# Patient Record
Sex: Female | Born: 1960 | Race: White | Hispanic: No | Marital: Married | Smoking: Never smoker
Health system: Southern US, Community
[De-identification: ages and names within clinical notes are randomized; demographics above are authoritative.]

## PROBLEM LIST (undated history)

## (undated) DIAGNOSIS — Z9221 Personal history of antineoplastic chemotherapy: Secondary | ICD-10-CM

## (undated) DIAGNOSIS — C801 Malignant (primary) neoplasm, unspecified: Secondary | ICD-10-CM

## (undated) DIAGNOSIS — Z923 Personal history of irradiation: Secondary | ICD-10-CM

## (undated) DIAGNOSIS — T7840XA Allergy, unspecified, initial encounter: Secondary | ICD-10-CM

## (undated) DIAGNOSIS — I2510 Atherosclerotic heart disease of native coronary artery without angina pectoris: Secondary | ICD-10-CM

## (undated) DIAGNOSIS — E7800 Pure hypercholesterolemia, unspecified: Secondary | ICD-10-CM

## (undated) DIAGNOSIS — Z9889 Other specified postprocedural states: Secondary | ICD-10-CM

## (undated) DIAGNOSIS — I519 Heart disease, unspecified: Secondary | ICD-10-CM

## (undated) DIAGNOSIS — R112 Nausea with vomiting, unspecified: Secondary | ICD-10-CM

## (undated) DIAGNOSIS — B009 Herpesviral infection, unspecified: Secondary | ICD-10-CM

## (undated) HISTORY — DX: Herpesviral infection, unspecified: B00.9

## (undated) HISTORY — PX: CORONARY ANGIOPLASTY: SHX604

## (undated) HISTORY — DX: Personal history of irradiation: Z92.3

## (undated) HISTORY — DX: Personal history of antineoplastic chemotherapy: Z92.21

## (undated) HISTORY — PX: POLYPECTOMY: SHX149

## (undated) HISTORY — PX: COLONOSCOPY: SHX174

## (undated) HISTORY — DX: Heart disease, unspecified: I51.9

---

## 1998-02-07 ENCOUNTER — Other Ambulatory Visit: Admission: RE | Admit: 1998-02-07 | Discharge: 1998-02-07 | Payer: Self-pay | Admitting: Gynecology

## 2000-03-01 ENCOUNTER — Other Ambulatory Visit: Admission: RE | Admit: 2000-03-01 | Discharge: 2000-03-01 | Payer: Self-pay | Admitting: Gynecology

## 2001-11-28 ENCOUNTER — Other Ambulatory Visit: Admission: RE | Admit: 2001-11-28 | Discharge: 2001-11-28 | Payer: Self-pay | Admitting: Gynecology

## 2001-12-08 ENCOUNTER — Encounter: Payer: Self-pay | Admitting: Gynecology

## 2001-12-08 ENCOUNTER — Encounter: Admission: RE | Admit: 2001-12-08 | Discharge: 2001-12-08 | Payer: Self-pay | Admitting: Gynecology

## 2003-01-31 ENCOUNTER — Encounter: Admission: RE | Admit: 2003-01-31 | Discharge: 2003-01-31 | Payer: Self-pay | Admitting: Gynecology

## 2003-10-09 ENCOUNTER — Other Ambulatory Visit: Admission: RE | Admit: 2003-10-09 | Discharge: 2003-10-09 | Payer: Self-pay | Admitting: Gynecology

## 2004-01-22 ENCOUNTER — Other Ambulatory Visit: Admission: RE | Admit: 2004-01-22 | Discharge: 2004-01-22 | Payer: Self-pay | Admitting: Gynecology

## 2004-07-23 ENCOUNTER — Encounter: Admission: RE | Admit: 2004-07-23 | Discharge: 2004-07-23 | Payer: Self-pay | Admitting: Gynecology

## 2004-10-10 ENCOUNTER — Other Ambulatory Visit: Admission: RE | Admit: 2004-10-10 | Discharge: 2004-10-10 | Payer: Self-pay | Admitting: Gynecology

## 2005-03-09 ENCOUNTER — Ambulatory Visit: Payer: Self-pay | Admitting: Internal Medicine

## 2005-08-20 ENCOUNTER — Encounter: Admission: RE | Admit: 2005-08-20 | Discharge: 2005-08-20 | Payer: Self-pay | Admitting: Gynecology

## 2005-10-13 ENCOUNTER — Other Ambulatory Visit: Admission: RE | Admit: 2005-10-13 | Discharge: 2005-10-13 | Payer: Self-pay | Admitting: Gynecology

## 2006-04-06 DIAGNOSIS — I519 Heart disease, unspecified: Secondary | ICD-10-CM

## 2006-04-06 HISTORY — DX: Heart disease, unspecified: I51.9

## 2006-06-05 ENCOUNTER — Ambulatory Visit: Payer: Self-pay | Admitting: Internal Medicine

## 2006-06-05 ENCOUNTER — Inpatient Hospital Stay (HOSPITAL_COMMUNITY): Admission: EM | Admit: 2006-06-05 | Discharge: 2006-06-11 | Payer: Self-pay | Admitting: Emergency Medicine

## 2006-06-05 HISTORY — PX: CARDIAC SURGERY: SHX584

## 2006-06-06 ENCOUNTER — Encounter: Payer: Self-pay | Admitting: Internal Medicine

## 2006-06-21 ENCOUNTER — Ambulatory Visit: Payer: Self-pay | Admitting: Internal Medicine

## 2006-06-21 ENCOUNTER — Ambulatory Visit: Payer: Self-pay

## 2006-06-21 ENCOUNTER — Encounter: Payer: Self-pay | Admitting: Cardiology

## 2006-07-22 ENCOUNTER — Encounter (HOSPITAL_COMMUNITY): Admission: RE | Admit: 2006-07-22 | Discharge: 2006-10-20 | Payer: Self-pay | Admitting: Internal Medicine

## 2006-08-09 ENCOUNTER — Ambulatory Visit: Payer: Self-pay | Admitting: Internal Medicine

## 2006-08-23 ENCOUNTER — Encounter: Admission: RE | Admit: 2006-08-23 | Discharge: 2006-08-23 | Payer: Self-pay | Admitting: Gynecology

## 2006-11-17 ENCOUNTER — Encounter (HOSPITAL_COMMUNITY): Admission: RE | Admit: 2006-11-17 | Discharge: 2006-11-19 | Payer: Self-pay | Admitting: Internal Medicine

## 2007-01-07 ENCOUNTER — Ambulatory Visit: Payer: Self-pay | Admitting: Internal Medicine

## 2007-05-09 ENCOUNTER — Other Ambulatory Visit: Admission: RE | Admit: 2007-05-09 | Discharge: 2007-05-09 | Payer: Self-pay | Admitting: Gynecology

## 2007-08-25 ENCOUNTER — Encounter: Admission: RE | Admit: 2007-08-25 | Discharge: 2007-08-25 | Payer: Self-pay | Admitting: Gynecology

## 2007-11-24 ENCOUNTER — Ambulatory Visit: Payer: Self-pay | Admitting: Internal Medicine

## 2007-11-30 ENCOUNTER — Ambulatory Visit: Payer: Self-pay | Admitting: Internal Medicine

## 2007-11-30 LAB — CONVERTED CEMR LAB: TSH: 1.66 microintl units/mL (ref 0.35–5.50)

## 2007-12-28 ENCOUNTER — Ambulatory Visit: Payer: Self-pay | Admitting: Gynecology

## 2008-09-11 ENCOUNTER — Encounter: Admission: RE | Admit: 2008-09-11 | Discharge: 2008-09-11 | Payer: Self-pay | Admitting: Gynecology

## 2009-02-19 ENCOUNTER — Telehealth: Payer: Self-pay | Admitting: Internal Medicine

## 2009-02-22 ENCOUNTER — Telehealth: Payer: Self-pay | Admitting: Internal Medicine

## 2010-05-06 NOTE — Progress Notes (Signed)
Summary: pt needs letter faxed to her   Phone Note Call from Patient Call back at Home Phone [phone]   Caller: Patient Reason for Call: Talk to Nurse Summary of Call: Letter dated 11/17 that Dr. Tenny Craw wrote needs to be faxed to [phone] Attn: to the patient Initial call taken by: Omer Jack,  February 22, 2009 12:25 PM  Follow-up for Phone Call        letter faxed, pt home # is disconnected, lm on pts work vm that it had been faxed to call back if didn't receive or had quefstions Meredith Staggers, RN  February 22, 2009 2:13 PM

## 2010-05-06 NOTE — Progress Notes (Signed)
Summary: refund from trip   Phone Note Call from Patient Call back at Home Phone [phone] Call back at [phone]   Caller: Patient Reason for Call: Talk to Nurse Details for Reason: Pt has a trip plan for [place] 12/17- Jan 1 or 18. hubsand fx leg. unable to go on hiking trip.  would like for dr. Tenny Craw to write a note concerning pt dx. so pt can get an refund.  Initial call taken by: Lorne Skeens,  February 19, 2009 10:56 AM  Follow-up for Phone Call        Spoke with Lizet which would like for Dr. Tenny Craw to write a not about pt's diagnosis. Pt. had planned a trip to [place] in 12/17th. Pt's husband fractured his leg. Pt. would like to get money back. RN let pt. know will send this message to MD and nurse. Okey with pt. Ollen Gross, RN, BSN  February 19, 2009 11:13 AM   Additional Follow-up for Phone Call Additional follow up Details #1::        Dictated letter Additional Follow-up by: Sherrill Raring, MD, [facility],  February 21, 2009 10:14 AM

## 2010-08-19 NOTE — Assessment & Plan Note (Signed)
[facility]                            CARDIOLOGY OFFICE NOTE   NAME:Sardina, JASMEET MANTON                         MRN:          [id number]  DATE:11/24/2007                            DOB:          1961/02/27    IDENTIFICATION:  Ms. Sirmon is a 50 year old woman.  I last saw her back  in October of last year.  She has a history of probable connective-  tissue disorder and had history of dissection of her LAD requiring drug-  eluting stent x3 (March 2008).  Note, she has been seen by Dr. Izell Los Veteranos II at  [facility].   Since seen, she has been doing very well.  She denies chest pain.  No  symptoms like the day she had her event.  She is active.  She is trying  to increase her activity, but again is careful to avoid any extreme  decelerations as she does in combative tennis.  She is going to enlist  in private training with light weights.   MEDICATIONS:  1. Plavix 75.  2. Toprol 50 b.i.d.  3. Lipitor 80.  4. Aspirin 81 mg daily.   PHYSICAL EXAMINATION:  The patient is in no distress.  Blood pressure is  127/80, pulse is 56 and regular.  NECK:  JVP is normal.  CARDIAC EXAM:  Regular rate and rhythm, S1, S2.  No S3.  No significant  murmurs.  ABDOMEN:  Benign.  EXTREMITIES:  No edema.  A 12-lead EKG sinus bradycardia 59 beats per  minute, nonspecific T-wave changes.   IMPRESSION:  1. Coronary artery disease.  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  Doing well.  Would keep her on aspirin and      Plavix.  She is now a year and half out from the event.  2. Dyslipidemia.  Due to have fasting lipids, LipoMed, next week, as      well as AST, ALT.   I will set to see the patient in March.  Encouraged her to stay active  again, and she is careful to monitor her levels and heart rates.     Pricilla Riffle, MD, [facility]  Electronically Signed    PVR/MedQ  DD: 11/24/2007  DT: 11/25/2007  Job #: [id number]

## 2010-08-19 NOTE — Assessment & Plan Note (Signed)
[facility]                            CARDIOLOGY OFFICE NOTE   NAME:Melanie Cook, Melanie Cook                         MRN:          [id number]  DATE:01/07/2007                            DOB:          04/15/1960    Melanie Cook is a 50 year old woman with history of dissection of her LAD  with a drug eluting stent placement x3 in March of this year. I last saw  her back in May.   In the interval she has been seen [facility].  Has not heard back fully from  her results but was told that she did not have a remarkable mutation.   The patient enrolled in some cardiac rehab but it became very difficult  with her work schedule.  She is now walking and eager to get back to  some organized physical activity with sport time.   She denies chest pain, no dizziness, feels she is able to do things.  When she is active she can get her heart rate into the 130 range.   CURRENT MEDICATIONS:  Include aspirin 325, Plavix 75, Toprol XL 100,  Lipitor stopped, Losartan stopped because of cough.   PHYSICAL EXAMINATION:  The patient is in no distress. Her blood pressure  is 102/76, pulse is 55, weight 141.  NECK:  JVP is normal.  LUNGS:  Clear.  CARDIAC EXAM:  Regular rate and rhythm S1, S2, no S3, no murmurs, no  clicks.  ABDOMEN:  Benign.  EXTREMITIES:  No edema.   IMPRESSION:  1. Coronary artery dissection with stents to the LAD, diagonal.      Clinically doing well.  I would keep her, with her history, on the      regimen she is on. She can back down on aspirin to 81 mg.  Her      blood pressure is marginal, I would like to use the beta blocker      for heart rate control and will hold off on losartan, she said she      coughed violently even on this.  Had problems with lisinopril.  2. Dyslipidemia.  Marked dyslipidemia with an LDL of 178 back in May,      she needs the Lipitor; I encouraged her and she will start back on      this with follow up labs in 8 weeks.  3. Health  care maintenance - I will be in touch with Dr. Izell [facility].  I      also said she could go ahead and work out at sport time again,      watching heart rate.  No heavy lifting, more for tone.   Followup in 6 months.    Pricilla Riffle, MD, [facility]  Electronically Signed   PVR/MedQ  DD: 01/08/2007  DT: 01/08/2007  Job #: [id number]

## 2010-08-19 NOTE — Assessment & Plan Note (Signed)
[facility]                            CARDIOLOGY OFFICE NOTE   NAME:Eichhorn, SAMANI DEAL                         MRN:          [id number]  DATE:08/09/2006                            DOB:          [birth year]    IDENTIFICATION:  Ms. Toole is a 50 year old woman who I follow in  clinic.  She had a dissection of her LAD and underwent stent placement  (non drug-eluting x3 to the LAD and 2nd diagonal back in early March).  I last saw her in clinic on March 17th.   In the interval she has done well.  She has been seen at [facility]  by Dr. Letha Cape for genetic evaluation.  She has had some blood work  drawn, but has not received the results back.   She continues on in cardiac rehab.  She started this a couple weeks ago  and is doing well, actually found that things were moving along slowly.  I have spoken to the physical therapist.  Blood pressures have been in  the 90s to 100s, 112 at the highest, lowest blood pressure was 86/66.  The patient has been asymptomatic through this.  Heart rate has been  allowed to climb into the 120s.  The patient has not had a problem with  this, and I okayed advancing this last week.   The patient denies dizziness, energy level has gone up since back in  March.  She denies chest pain, no PND, no back pain.   CURRENT MEDICATIONS:  1. Aspirin 325 daily.  2. Plavix 75 daily.  3. Toprol XL 100 daily.  4. Lisinopril 10 daily.  5. Lipitor 80 daily.   PHYSICAL EXAMINATION:  The patient is in no distress.  Blood pressure 97/68, pulse is 64, weight 139, stable.  LUNGS:  Clear.  NECK:  JVP is normal.  CARDIAC EXAM:  Regular rate and rhythm, S1, S2, no S3, S4.  No murmurs.  ABDOMEN:  Benign.  EXTREMITIES:  No edema, good pulses.   A 12-lead EKG shows sinus rhythm at 62 beats per minute.  There is  trivial ST elevation in II and F.  Trivial ST elevation V1-V4.  Concave  in all leads.  T-wave inversion V1, V2.  T-wave  inversion noted in March  in V1-V4.   IMPRESSION:  Ms. Strike is a 50 year old woman with dissection to her  left anterior descending while in a doubles tennis match back in March.  She received 3 stents to the left anterior descending.  Will remain on  the current medical regimen.  She is tolerating it well symptom wise.  Her blood pressure has been on the lower side.  If she becomes  symptomatic, I would pull back on the Lisinopril a bit before stopping.   Note:  She does note a dry cough, I did not mention this earlier, since  discharge.  She thinks it is getting worse.  I told her for a few days  to hold the Lisinopril, and we would see if her symptoms resolve.  If  they do, I would choose  to put her on a low-dose angiotensin receptor  blocker instead.  I will call her to check up on this.  She will  continue on in cardiac rehab, again, with advancement in her target  metabolic rate.  We have discussed limitations based on the guidelines.  I think, again, no impact sports, avoiding sudden sharp changes in  activity (rapid stops/starts), leaning towards more non-competitive  activities.  This could include tennis, jogging, biking, and, again, can  be modified depending on how she responds.   I will set to see her back in a few months.  Again, I will be in touch  with her regarding her response to the Lisinopril discontinuation.  She  should now come in for a fasting lipid panel as well to follow her  response on Lipitor.     Pricilla Riffle, MD, [facility]  Electronically Signed    PVR/MedQ  DD: 08/10/2006  DT: 08/10/2006  Job #: [id number]   cc:   Lahoma Crocker, MD

## 2010-08-22 NOTE — Assessment & Plan Note (Signed)
[facility]                            CARDIOLOGY OFFICE NOTE   NAME:Melanie Cook, Melanie Cook                         MRN:          [id number]  DATE:06/21/2006                            DOB:          September 19, 1960    IDENTIFICATION:  The patient is a 50 year old woman who I followed in  the hospital.  She had a dissection of her LAD, underwent stent  placement on March 5 (bare metal stent x3 to the LAD and into the second  diagonal).  She was discharged home 2 days after.  Since seen she has  been doing well, she denies chest pain.  She is doing activities as  tolerated, has not begun driving yet.  She says yesterday she did some  paperwork, went to a meeting, and took a walk with her daughter.  By the  end of the day she was very tired.  She denied any significant shortness  of breath or fatigue during these activities, but by 8 pm was ready for  bed.  Went to bed, slept until 7 am.   Denies dizziness.   CURRENT MEDICATIONS:  1. Aspirin 325 daily.  2. Plavix 75 daily.  3. Toprol XL 100 daily.  4. Lisinopril 10 daily.  5. Lipitor 80 daily.   PHYSICAL EXAMINATION:  On exam the patient is in no distress.  Blood  pressure 104/68, pulse is 60 and regular, weight 138.  NECK:  JVP is normal.  LUNGS:  Clear.  CARDIAC:  Regular rate and rhythm, S1, S2, no S3, no murmurs.  ABDOMEN:  Benign with mild left sided tenderness.  EXTREMITIES:  Right groin without hematoma or bruit, 2+ distal pulses,  no edema.  A 12-LEAD EKG:  Shows normal sinus bradycardia 56 beats per minute, PR  interval 186 milliseconds.  Septal MI.  T wave inversion V2, V1-V4  (different from hospitalization).   A 2D echo today done shows an LVF that is normal, about 65%.  There is  some anterior septal hypokinesis and distal posterior hypokinesis.  Seems to be improved though from echo done in the hospital.   IMPRESSION:  Melanie Cook is a 50 year old woman who had spontaneous  dissection of her  left anterior descending.  Her aortic root and aorta  are normal.  Initial plan was for medical therapy but she developed  worsening symptoms and actually worsened flow through the left anterior  descending and on the 5th underwent stent placement.  She was left with  residual dissection of the distal left anterior descending and there was  a small dissection over in the second diagonal.   Clinically she is doing well recovering.   PLAN:  To continue medical therapy.  I have talked to her about all her  medicines today extensively (one hour total).  She should remain on the  Plavix for now, question longterm plans for this.  Continue on aspirin.  Keep the beta blocker at a current dose, I am not convinced her fatigue  is due to this, I think it may be getting back into her activities.  Consider  backing down though to 75 if her symptoms do not improve.  Continue on lisinopril for now.  I would also continue on a statin, we  will follow up lipids in April.  Again, I have reviewed her  echocardiograms with her, her catheterization with her.  I have  discussed overall plans somewhat.  Again we hope there will not be any  further dissection in the LAD, but again will need follow clinically.   I would increase activities slowly.  I think she can drive, I think she  is able to return to some work, probably a third of her normal.  I would  discourage traveling right now on her own, but I think she is okay to  travel with her family.   I have discussed her case with Dr. Lahoma Crocker at [facility], she has  agreed to see Melanie Cook as an initial evaluation with hopes that the  family can be evaluated fully.  I will send her records to [facility] and they  will contact the patient for her appointment.   For now, I would like to see the patient back in about 6 weeks, sooner  if problems develop.  If she has any problems of course she should call  sooner.     Pricilla Riffle, MD, [facility]  Electronically  Signed    PVR/MedQ  DD: 06/21/2006  DT: 06/22/2006  Job #: [id number]   cc:   Melanie Mends. Fabian Sharp, MD

## 2010-08-22 NOTE — Letter (Signed)
February 20, 2009     RE:  [name], [name]  MRN:  [id number]  /  DOB:  [birth year]   To Whom It May Concern:   Melanie Cook is the patient I follow in Cardiology Clinic.  She has a  history of coronary artery dissection a couple years ago and has had  several stents placed in one of her coronary arteries.  She has a  probable connective tissue disorder.   With this, I have limited her activities.  I do not want her doing any  heavy lifting where she has to strain, I do not want her doing any rapid  accelerations and activities such as sprinting.  I have her on  medication to control her heart rate.   It has come to my attention that her husband recently fractured his leg  and is going to have surgery.  They had planned a trip to [place]  beginning March 22, 2009.  The trip was planned to be a hiking  vacation; however without her husband there I do not want her to have to  handle the packs on her own, along with her children.  I am requesting  that you provide refund for this trip.  Unfortunately, without her  husband being able to go, I do not think it is safe for her to try to do  things on her own with her children.  If you have any questions, please  feel free to contact me at [phone].    Sincerely,      Pricilla Riffle, MD, [facility]  Electronically Signed    PVR/MedQ  DD: 02/20/2009  DT: 02/21/2009  Job #: [id number]

## 2010-08-22 NOTE — Cardiovascular Report (Signed)
Melanie Cook, Melanie Cook NO.:  [id number]   MEDICAL RECORD NO.:  [id number]          PATIENT TYPE:  INP   LOCATION:  2921                         FACILITY:  [facility]   PHYSICIAN:  Salvadore Farber, MD  DATE OF BIRTH:  21-Jul-1960   DATE OF PROCEDURE:  06/09/2006  DATE OF DISCHARGE:                            CARDIAC CATHETERIZATION   PROCEDURE:  Coronary angiography, intravascular ultrasound of the LAD,  bare metal stent x3 to the LAD with extension into the proximal second  diagonal branch.   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  She underwent diagnostic angiography by Dr.  Riley Kill on the 3rd which demonstrated this.  At that time, there was  substantial flow in the true lumen with a small false lumen and she had  been pain-free for 48 hours.  Therefore,  conservative management was  recommended.   This morning, she developed recurrent chest discomfort associated with  dynamic ST changes in the anterior leads.  After discussion with Dr.  Riley Kill, we therefore decided to bring her back to the cardiac  catheterization lab for repeat angiography and possible coronary  intervention.   PROCEDURAL TECHNIQUE:  Informed consent was obtained.  Under 1%  lidocaine local anesthesia, a 5-French sheath was placed in the right  common femoral artery using the modified Seldinger technique.  Diagnostic angiography was performed using JL-4 and JR-4 catheters.  This demonstrated 99% stenosis in the mid-LAD over a long segment.  There was TIMI I flow and more extensive flow in the false lumen then in  the true lumen.  I discussed the findings with Dr. Riley Kill.  We both  agreed that given the TIMI I flow and ongoing discomfort we should  attempt percutaneous revascularization.   Anticoagulation was initiated with bivalirudin.  The patient had already  been maintained on Plavix and aspirin.  Sheath was upsized over wire to  6-French.  A 6-French CLS 3.5 guide was advanced over wire and engaged  in the ostium of the left main.  I began with a Hi-Torque floppy wire.  I carefully manipulated this into what appeared to be the true lumen.  I  advanced it through the majority of the dissected segment.  I then  advanced a 1.5 x 12-mm Voyager over the wire balloon over this into the  distal portion of the dissected segment.  I withdrew the wire and  performed injection of dilute contrast.  This confirmed a position  within the true lumen.  I then manipulated the wire into the second  diagonal branch.  I reconfirmed position within the true lumen of this  again by contrast injection.  I then exchanged for a Prowater wire  positioning in the diagonal.  I then proceeded to balloon angioplasty  using the 1.5 mm balloon over the entirety of the dissected segment.  Repeat angiography demonstrated TIMI III flow, further confirming that  we were within the true lumen.  I then performed intravascular  ultrasound.  I was unable to identify a clear entry flap.  After  discussion with Dr. Riley Kill, we decided  she would be best served with  stenting of the entire effected segment.   I began by stenting proximally by placing a 3.0 x 16-mm Liberte stent  just proximal to the first diagonal.  I deployed it at 16 atmospheres.  I then advanced a 2.75 x 32-mm Liberte stent to overlap the distal  portion of this stent and just before the takeoff of the second diagonal  branch.  I deployed this at 14 atmospheres.  I then postdilated the  stent using a 3.0-mm Quantum distally at 16 atmospheres and 18  atmospheres at the overlap segment.  I then further postdilated using a  3.5-mm Quantum at the most proximal edge.  This was at 16 atmospheres.  It was then advanced to the segment of overlap and inflated to 18  atmospheres.  I then performed intravascular ultrasound.   This  demonstrated incomplete apposition of the most proximal 3 mm of the  stented segment.  I then further postdilated using a 4-mm Quantum at 16  atmospheres.  Repeat intravascular ultrasound demonstrated excellent  apposition.   There remained dissection flap distal to the stented segment with  extension into both the second diagonal and the apical LAD.  As the  second diagonal was bigger than the apical LAD, we elected to place an  additional stent to the distal margin of the previously stented segment  out into D2.  To that end, I advanced a 2.0 x 12-mm Mini-Vision and  deployed it at 14 atmospheres.  I postdilated the stent using a 2.25 x 8-  mm Quantum at 14 atmospheres distally.  I then postdilated the proximal  segment using the 3.0 x 15-mm Quantum at 16 atmospheres.  Final  angiography demonstrated no residual stenosis in the stented segment, no  residual dissection within the stented segment, TIMI III flow into both  the second diagonal and apical LAD.  There did remain non-flow limiting  dissections within the second diagonal and distal LAD.  As these vessels  were small diameter, I elected to manage them conservatively with  administration of eptifibatide.  Flow had improved from TIMI I to TIMI  III through the procedure.   COMPLICATIONS:  None.   FINDINGS:  1. Left main:  Angiographically normal.  2. LAD:  A 99% deep dissection with TIMI I flow and a long extension      of the dissection.  This was stented to no residual stenosis.  3. Circumflex:  Moderate-sized vessel giving rise to 2 marginals.  It      is angiographically normal.  4. RCA:  Moderate-sized dominant vessel.  It is angiographically      normal.   IMPRESSION/PLAN:  Successful percutaneous revascularization of the left  anterior descending artery and second diagonal branch using bare metal  stent.  We will manage the residual, non-flow limiting dissection with  eptifibatide for 18  hours.     Salvadore Farber, MD  Electronically Signed     WED/MEDQ  D:  06/09/2006  T:  06/10/2006  Job:  [id number]

## 2010-08-22 NOTE — Cardiovascular Report (Signed)
NAMEBEN, SANZ NO.:  [id number]   MEDICAL RECORD NO.:  [id number]          PATIENT TYPE:  INP   LOCATION:  2022                         FACILITY:  [facility]   PHYSICIAN:  Arturo Morton. Riley Kill, MD, FACCDATE OF BIRTH:  [birth year]   DATE OF PROCEDURE:  06/07/2006  DATE OF DISCHARGE:                            CARDIAC CATHETERIZATION   INDICATIONS:  Ms. Shugars is a very nice 50 year old female who presented  after playing tennis with discomfort.  Both troponins and CK MBs were  positive.  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  A  concrete diagnosis of Marfan's, however, is not noted.  The current  study was done to assess her coronary anatomy.   PROCEDURES:  1. Left heart catheterization  2. Selective coronary arteriography.  3. Selective left ventriculography.  4. Aortic root aortography.   DESCRIPTION OF PROCEDURE:  The patient was brought to the  catheterization laboratory and prepped and draped in the usual fashion.  Through an anterior puncture, the femoral artery was entered and a 5-  [place] sheath was placed.  Views of the left and right coronary arteries  were then obtained in multiple angiographic projections.  Central aortic  and left ventricular pressures were measured with a pigtail catheter and  ventriculography was performed in the RAO projection.  Following a  pressure pullback, aortography was performed without complication.  She  tolerated the procedure well without complication.  All catheters were  removed and the femoral sheath flushed.   Dr. Tenny Craw, Dr. Excell Seltzer and Dr. Samule Ohm all came into the laboratory where  we carefully discussed the potential options.  There was consensus  opinion that observation with intravenous anticoagulation would be the  best course of action at this time, and she was taken into the holding  area.  Dr. Tenny Craw and I then reviewed the  films with the patient's  husband.  There were no complications.   HEMODYNAMIC DATA:  1. Central aortic pressure 127/79, mean 99.  2. Left ventricular pressure 126/16.  3. No gradient on pullback across aortic valve.   ANGIOGRAPHIC DATA:  1. Ventriculography was done in the RAO projection.  Overall, systolic      function is only mildly depressed and there is a mild wall motion      abnormality involving the distal anterolateral wall and apical      segment.  Ejection fraction would be estimated at 50%.  There was      no significant mitral regurgitation.  2. Aortic root aortography with minimal contrast demonstrates no      obvious evidence of dissection.  There is also no evidence of      aortic regurgitation.  3. The left main is free of critical disease.  4. The left anterior descending artery courses to the apical tip.  The      LAD provides a moderate-sized diagonal going over the anterolateral      wall which is intact.  Just after the takeoff of this diagonal is a  bend point in the artery, and there is some haziness followed by 2      septal perforators.  Just beyond this there is about 70-80%      narrowing with an area of tortuosity; and just beyond this there is      a double density suggesting an external dissection in the vessel      wall.  Distally, the vessel divides into a diagonal branch whose      proximal aspect is slightly more narrow than its distal aspect and      a fairly small caliber distal apical LAD.  5. The circumflex provides a small intermediate, and then a large      marginal and 2 small distal marginals.  The circumflex appears free      of critical disease.  6. The right coronary artery demonstrates smooth contour throughout      with a large posterior descending branch after a small acute      marginal and 2 smaller posterolateral branches.  This appears free      of disease as well.   CONCLUSIONS:  1. Spontaneous mid-left anterior  dissection with possible retrograde      and antegrade hematoma creating haziness in the vessel.  2. Preserved overall LV function with a mild wall motion abnormality      involving the distal LAD territory.  3. No evidence of aortic dissection.   DISPOSITION:  Dr. Samule Ohm, Dr. Excell Seltzer, Dr. Tenny Craw and I have all reviewed  the films in detail.  Our concern is that this likely represents a  spontaneous dissection with possible intramural hematoma extending both  proximally and distally.  The apical portion of the vessel is very small  in caliber, and this could also represent external hematoma as well.  The consensus opinion at this time would be to continue to treat the  patient with anticoagulants, with potential return to the laboratory in  3-4 days.  We will notify the cardiovascular surgeons and have them see  her and be aware of her status as well.  The patient's husband will make  contact and try to determine who has previously seen her brother at the  [facility] with regard to [facility] evaluation.  With regard to  coronary stenting, the consensus opinion was that it would be unclear  where to both start and stop treatment and that much of the narrowing  could likely represent external hematoma.  The patient will be watched  closely.      Arturo Morton. Riley Kill, MD, [facility]  Electronically Signed     TDS/MEDQ  D:  06/07/2006  T:  06/07/2006  Job:  [id number]   cc:   Pricilla Riffle, MD, [facility]  Neta Mends. Panosh, MD  CV Laboratory

## 2010-08-22 NOTE — H&P (Signed)
NAMEAMARACHUKWU, LAKATOS NO.:  [id number]   MEDICAL RECORD NO.:  [id number]          PATIENT TYPE:  INP   LOCATION:  2022                         FACILITY:  [facility]   PHYSICIAN:  Pricilla Riffle, MD, FACCDATE OF BIRTH:  04-Feb-1961   DATE OF ADMISSION:  06/05/2006  DATE OF DISCHARGE:                              HISTORY & PHYSICAL   IDENTIFICATION:  Ms. Given is a 50 year old who came into the emergency  room for complaints of chest pain, back pain.   HISTORY OF PRESENT ILLNESS:  The patient has no known history of heart  problems.  She has never had any problems with chest pain in the past.   She was playing tennis today.  She had a bagel and cream cheese prior.  She actually was doing well, and then she began to feel and indigestion  feeling that went to her back.  Felt like she was breathing cold air.  She sat down and just did not feel good.  Then her joints felt achy, her  fingers became tingly.  She took a drink and then began to feel a little  bit better.  Noted slight nausea through this.  Decided to come to the  emergency room for further evaluation, now feeling okay.  Note, the  patient is getting over a sinus infection, cough, started a few weeks  ago though, now at tail end.  No change in ability to do things, quite  active.   ALLERGIES:  None.   MEDICATIONS:  None.   PAST MEDICAL HISTORY:  Negative.  Had cholesterol checked a couple of  years ago and was reported  good.   SOCIAL HISTORY:  The patient is married with two children.  Does not  smoke.  Drinks occasionally.   FAMILY HISTORY:  Significant for a mother who died at age 76, had a  history of an MI.  Father just died recently at age 67.  Had CAD and a  pacemaker  but died of other causes.  Family history is also significant  for one brother who has Marfan's at age 58, now 9.  At age 77 had  aortic dissection.  He is 6 feet 5 inches.  His son also with Marfan's  being followed.  No  other  known Marfan's.  The patient has not had an  echocardiogram.   REVIEW OF SYSTEMS:  All systems are reviewed and no history of GE  reflux.  Again, recovering from UTI that began several weeks ago.  Otherwise, all systems negative.  See above problems.  The patient  denied any change in symptoms with motion.  Really felt like she was  breathing in cold air.  No history of asthma.   PHYSICAL EXAMINATION:  On examination, the patient currently in no acute  distress.  Temperature afebrile, pulse 73, blood pressure 129/67, respiratory rate  is 16.  HEENT:  Normocephalic, atraumatic. PERRL, EOMI.  Sclerae clear.  Palate  normal and no lenticular dislocation on gross physical examination.  NECK:  JVP is normal, no thyromegaly, no bruits.  CARDIAC:  Regular rate and rhythm, S1, S2, no S3, S4, murmurs, or rubs  noted.  No clicks.  LUNGS:  Clear to auscultation.  No wheezes or rales.  ABDOMEN:  Benign, no hepatosplenomegaly, normal bowel sounds, no masses,  supple.  EXTREMITIES:  Good distal pulses throughout.  The patient is somewhat  hyperreflexic as noted in her upper extremities with increased range of  motion of the thumb and risk joint.   Chest x-ray is normal.  Aorta is well within normal limits.  Cardiac  silhouette normal.   EKG shows sinus rhythm.  No acute ST, T wave changes.   LABORATORY DATA:  Hemoglobin 14.3, BUN and creatinine 7 and 0.7,  potassium 4.2.  Initial CK-MB in the emergency room was 1.2, the second  was 3.5.  Troponin initially was 0.07, second however was 0.34.   IMPRESSION:  The patient is a 50 year old female, very active, no known  history of coronary artery disease.  Family history is significant for a  brother with Marfan's, but she is hyperreflexic but with no other signs,  now with an episode of chest pain, back pain, tenderness, pressure  sensation gone now.  EKG is negative.  She is pain free.   Chest x-ray is negative.   Examination significant for  increased flexibility, no rub noted.  Labs  however are significant for initial troponin 0.07 increasing to 0.34.   RECOMMENDATIONS:  1. Repeat to confirm.  2. Continue to cycle enzymes every eight hours.  3. Give aspirin now and continue, otherwise no other anticoagulation      for now.  Low-dose beta blockade 12.5 mg b.i.d.  If enzymes remain      slightly elevated, will get chest CT for evaluation of aorta given      family history.  If CT negative for dissection, consideration for      catheterization.  Does not sound like myocarditis or pericarditis.      Will continue to follow up.  Check lipids in a.m.      Pricilla Riffle, MD, [facility]  Electronically Signed     PVR/MEDQ  D:  06/05/2006  T:  06/06/2006  Job:  [id number]

## 2010-08-22 NOTE — Discharge Summary (Signed)
NAMEBRUCHY, MIKEL NO.:  [id number]   MEDICAL RECORD NO.:  [id number]          PATIENT TYPE:  INP   LOCATION:  3733                         FACILITY:  [facility]   PHYSICIAN:  Salvadore Farber, MD  DATE OF BIRTH:  [birth year]   DATE OF ADMISSION:  DATE OF DISCHARGE:                               DISCHARGE SUMMARY   CARDIOLOGIST:  She is new to Dr. Dietrich Pates.   PRIMARY CARE PHYSICIAN:  Dr. Berniece Andreas.   REASON FOR ADMISSION:  Chest pain.   DISCHARGE DIAGNOSES:  1. 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      a.     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      b.     Status post bare-metal stenting times 3 to the LAD and       second diagonal this admission.  2. Preserved LV function with an ejection fraction of 50% at initial      catheterization June 07, 2006.  3. Hyperlipidemia.      a.     High-dose statin initiated this admission.  4. Family history of coronary artery disease.  5. Family history of Marfan's syndrome.   PROCEDURES PERFORMED ON THIS ADMISSION:  1. Cardiac catheterization by Dr. Shawnie Pons on June 07, 2006,      revealing spontaneous mid LAD-section with possible retrograde      antegrade hematoma creating haziness in the vessel.  Preserved      overall LV function with mild wall motion abnormality involving the      distal LAD territory and no evidence of aortic dissection.  Please      see a copy of his dictated note for complete details.  2. Status post percutaneous coronary intervention by Dr. Randa Evens and cardiac catheterization on June 10, 2006.  Films      revealed left main normal, LAD 99% deep dissection with TIMI-1 flow      and long extension of the dissection.  Circumflex and RCA both      normal.  Bare metal stenting times 3 performed to the LAD and      second diagonal.  Stents used:  Liberte monorail 3 x 16 mm, Liberte      monorail 2.75 x  32 mm, and a multi-link mini-vision 2 x 12 mm.   HISTORY:  Ms. Given is a 50 year old female patient with essentially no  past medical history who presented to the emergency room on the date of  admission with complaints of chest discomfort after playing tennis.  It  felt like indigestion.  Her EKG in the emergency room was unremarkable.  Her lab work was remarkable for a troponin of 0.07 and then a troponin  of 0.34.  She was admitted for further evaluation and treatment.   HOSPITAL COURSE:  The patient ruled in for non-ST elevation and  myocardial infarction.  Her troponin peaked at 1.78, and her CK-MB  peaked at 12.  She underwent echocardiogram that  revealed an EF of 55%  with hypokinesis of the distal interseptal and distal anteroseptal and  apical walls.  Her cholesterol was noted to be abnormal with an LDL of  178, and she was started on high dose statins.  She was referred for  cardiac catheterization.  This was performed on June 07, 2006, by Dr.  Riley Kill.  As noted above, she had spontaneous dissection of the mid LAD.  The situation was reviewed with Drs. Heywood Bene, and [place].  She was  watched closely over the next several days.  She was continued on  aspirin, heparin, and Plavix.  She began to feel chest and back  discomfort again on June 09, 2006.  This was similar to what she had  when she presented to the hospital.  Her ECGs were noted to have dynamic  changes.  Therefore, the patient was taken back to the cardiac  catheterization lab for re-look angiography.  Dr. Samule Ohm performed the  procedure on June 09, 2006.  As noted above, the dissection was repaired  in the LAD with three bare-metal stents.  She tolerated the procedure  well and had no immediate complications.  She did have some residual  dissection that was treated with Integrilin over the next 18 hours.  She  did have some nausea and emesis.  This was treated with antiemetics and  proton pump inhibitors.  She  remained stable over the next two days.  On  the morning of June 11, 2006, she was seen by Dr. Samule Ohm.  She was  feeling well and was felt stable enough for discharge to home.  She will  need an outpatient echocardiogram to reassess her LV function.  She will  need close followup with Dr. Tenny Craw.   LABS AND X-RAY DATA:  A 2-D echocardiogram as noted above.  White count  9,000, hemoglobin 13, hematocrit 37.3, MCV 100, platelet count 177,000.  INR on admission was 1.  D-dimer less than 0.22.  On March 6th, sodium  was 132, potassium 3.6, glucose 117, BUN 7, creatinine 0.62, calcium  8.8, total bilirubin 1.3, alkaline phosphatase 52, AST 41, ALT 13, total  protein 6.8, albumin 4.1.  Cardiac enzymes as noted above.  Post  procedure cardiac enzymes peaking at CK-MB of 108 and troponin-I of  9.43.  Lipid panel with a total cholesterol of 260, triglycerides 106,  HDL 61, and LDL 178.  TSH 2.477.  Urine pregnancy negative.   Chest x-ray on admission:  No acute cardiopulmonary disease.   DISCHARGE MEDICATIONS:  1. Aspirin 325 mg daily.  2. Plavix 75 mg daily - Dr. Samule Ohm suggested that this continue for 6      months.  3. Toprol-XL 100 mg daily.  4. Lisinopril 10 mg daily.  5. Lipitor 80 mg nightly.   DIET:  Low-fat, low-sodium, heart-healthy diet.   WOUND CARE:  She is to call our office for any groin swelling, bleeding,  bruising, or fever.   ACTIVITY:  She is to increase her activity slowly.  She may walk up  steps.  She may shower.  No lifting or sexual activity for two weeks.  No driving for one week.  She may return to work on June 22, 2006.  This will be discussed further when she follows up with Dr. Tenny Craw in two  weeks.   FOLLOWUP:  1. She sees Dr. Tenny Craw on June 21, 2006, at 1:30 p.m.  2. She is set up for an echocardiogram March 18th at  3:00 p.m.  3. She should follow up with Dr. Fabian Sharp as scheduled.  Total physician and PA time was greater than 30 minutes.      Tereso Newcomer, PA-C      Salvadore Farber, MD  Electronically Signed    SW/MEDQ  D:  06/11/2006  T:  06/11/2006  Job:  [id number]   cc:   Neta Mends. Fabian Sharp, MD

## 2011-01-12 ENCOUNTER — Other Ambulatory Visit: Payer: Self-pay | Admitting: Gynecology

## 2011-01-12 DIAGNOSIS — Z1231 Encounter for screening mammogram for malignant neoplasm of breast: Secondary | ICD-10-CM

## 2011-01-15 ENCOUNTER — Ambulatory Visit
Admission: RE | Admit: 2011-01-15 | Discharge: 2011-01-15 | Disposition: A | Discharge status: Home / Self Care | Payer: BC Managed Care – PPO | Source: Ambulatory Visit | Attending: Gynecology | Admitting: Gynecology

## 2011-01-15 DIAGNOSIS — Z1231 Encounter for screening mammogram for malignant neoplasm of breast: Secondary | ICD-10-CM

## 2011-03-23 ENCOUNTER — Encounter: Payer: Self-pay | Admitting: Internal Medicine

## 2011-05-22 ENCOUNTER — Encounter: Payer: BC Managed Care – PPO | Admitting: Internal Medicine

## 2011-07-13 ENCOUNTER — Ambulatory Visit (INDEPENDENT_AMBULATORY_CARE_PROVIDER_SITE_OTHER): Payer: BC Managed Care – PPO | Admitting: Internal Medicine

## 2011-07-13 ENCOUNTER — Encounter: Payer: Self-pay | Admitting: Internal Medicine

## 2011-07-13 VITALS — BP 108/68 | HR 60 | Ht 66.0 in | Wt 146.0 lb

## 2011-07-13 DIAGNOSIS — E7849 Other hyperlipidemia: Secondary | ICD-10-CM | POA: Insufficient documentation

## 2011-07-13 DIAGNOSIS — E785 Hyperlipidemia, unspecified: Secondary | ICD-10-CM

## 2011-07-13 DIAGNOSIS — I2542 Coronary artery dissection: Secondary | ICD-10-CM

## 2011-07-13 NOTE — Progress Notes (Signed)
HPI Patient is a 51 year old with a history of coronary dissection of the LAD in 2008.  She is s/p PTCA/DES x3. She has been seen by Dr. Izell Shields at [facility], has probable connective tissue defect though not defineable.  She also has a history of dyslipidemia I saw her in clinic last in 2009. Since seen she has done OK from a cardiac standpoint.  She denies CP  Breathing is OK. She had signif anxiety after events of 2008.  She feels she has learned to deal with this and with meditation is doing much better. She is fairly active.  Plays doubles tennis.  Walks on treadmill.  Finds HR increases quickly.  Denies dizziness.   No Known Allergies  Current Outpatient Prescriptions  Medication Sig Dispense Refill  . Cholecalciferol (VITAMIN D PO) Take by mouth.        No past medical history on file.  No past surgical history on file.  No family history on file.  History   Social History  . Marital Status: Married    Spouse Name: N/A    Number of Children: N/A  . Years of Education: N/A   Occupational History  . Not on file.   Social History Main Topics  . Smoking status: Never Smoker   . Smokeless tobacco: Not on file  . Alcohol Use: Not on file  . Drug Use: Not on file  . Sexually Active: Not on file   Other Topics Concern  . Not on file   Social History Narrative   The patient is married with two children.Does not smoke -Drinks Occasionally.    Review of Systems:  All systems reviewed.  They are negative to the above problem except as previously stated.  Vital Signs: Pulse 60  Ht 5\' 6"  (1.676 m)  Wt 146 lb (66.225 kg)  BMI 23.56 kg/m2  BP  120/  Physical Exam Patient is in NAD HEENT:  Normocephalic, atraumatic. EOMI, PERRLA.  Neck: JVP is normal. No thyromegaly. No bruits.  Lungs: clear to auscultation. No rales no wheezes.  Heart: Regular rate and rhythm. Normal S1, S2. No S3.   No significant murmurs. PMI not displaced.  Abdomen:  Supple, nontender. Normal bowel  sounds. No masses. No hepatomegaly.  Extremities:   Good distal pulses throughout. No lower extremity edema.  Musculoskeletal :moving all extremities.  Neuro:   alert and oriented x3.  CN II-XII grossly intact  EKG:  Sinus bradycardia  50 bpm.  Septal MI.  Assessment and Plan:

## 2011-07-19 NOTE — Assessment & Plan Note (Signed)
Patient to be set up for fasting lipids  With signif of dyslipidemia in 2009 she most likely needs a statin.

## 2011-07-19 NOTE — Assessment & Plan Note (Signed)
Doing well.  I have reviewed with interventional service.  She had stopped meds over a year ago.  I would recomm ecASA 81 mg given that she has 3 DES (even in setting of probable CT diease).   I have encouraged her to stay active.  I would avoid sudden bursts of activity.  Instead recomm more constant moderate acitvity.   I do not think she would tolerate other meds with BP, HR.

## 2011-07-23 ENCOUNTER — Other Ambulatory Visit: Payer: Self-pay | Admitting: *Deleted

## 2011-07-23 ENCOUNTER — Telehealth: Payer: Self-pay | Admitting: *Deleted

## 2011-07-23 DIAGNOSIS — E782 Mixed hyperlipidemia: Secondary | ICD-10-CM

## 2011-07-23 NOTE — Telephone Encounter (Signed)
Need to send in script for Lipitor 40 mg per Dr.Ross. LM for patient to call back to give [place] pharmacy name.

## 2011-07-27 MED ORDER — ATORVASTATIN CALCIUM 40 MG PO TABS: 40.0000 mg | ORAL_TABLET | Freq: Every day | ORAL | Status: DC

## 2011-07-27 NOTE — Telephone Encounter (Signed)
Called patient in follow up and asked what pharmacy she would like to use. She wants script sent to [facility] on [address]. She knows to take one half of a Lipitor 40 mg.

## 2011-10-02 ENCOUNTER — Other Ambulatory Visit: Payer: BC Managed Care – PPO

## 2011-12-09 ENCOUNTER — Other Ambulatory Visit: Payer: Self-pay | Admitting: Gynecology

## 2011-12-09 DIAGNOSIS — Z1231 Encounter for screening mammogram for malignant neoplasm of breast: Secondary | ICD-10-CM

## 2012-01-25 ENCOUNTER — Other Ambulatory Visit: Payer: Self-pay | Admitting: Obstetrics & Gynecology

## 2012-01-25 ENCOUNTER — Ambulatory Visit
Admission: RE | Admit: 2012-01-25 | Discharge: 2012-01-25 | Disposition: A | Discharge status: Home / Self Care | Payer: BC Managed Care – PPO | Source: Ambulatory Visit | Attending: Gynecology | Admitting: Gynecology

## 2012-01-25 DIAGNOSIS — Z1231 Encounter for screening mammogram for malignant neoplasm of breast: Secondary | ICD-10-CM

## 2012-03-24 LAB — HM PAP SMEAR: HM Pap smear: NEGATIVE

## 2012-07-07 ENCOUNTER — Encounter: Payer: Self-pay | Admitting: Internal Medicine

## 2012-07-29 ENCOUNTER — Ambulatory Visit: Payer: BC Managed Care – PPO | Admitting: Internal Medicine

## 2012-09-05 ENCOUNTER — Ambulatory Visit: Payer: BC Managed Care – PPO | Admitting: Internal Medicine

## 2012-10-14 ENCOUNTER — Ambulatory Visit: Payer: BC Managed Care – PPO | Admitting: Internal Medicine

## 2012-12-19 ENCOUNTER — Other Ambulatory Visit: Payer: Self-pay

## 2012-12-19 DIAGNOSIS — Z1231 Encounter for screening mammogram for malignant neoplasm of breast: Secondary | ICD-10-CM

## 2013-01-26 ENCOUNTER — Ambulatory Visit (INDEPENDENT_AMBULATORY_CARE_PROVIDER_SITE_OTHER): Payer: BC Managed Care – PPO | Admitting: Internal Medicine

## 2013-01-26 ENCOUNTER — Encounter: Payer: Self-pay | Admitting: Internal Medicine

## 2013-01-26 VITALS — BP 105/50 | HR 53 | Ht 66.0 in | Wt 137.0 lb

## 2013-01-26 DIAGNOSIS — I2542 Coronary artery dissection: Secondary | ICD-10-CM

## 2013-01-26 DIAGNOSIS — E785 Hyperlipidemia, unspecified: Secondary | ICD-10-CM

## 2013-01-26 NOTE — Progress Notes (Signed)
HPI Patient is a 52 year old with a history of coronary dissection of the LAD in 2008.  She is s/p PTCA/DES x3. She has been seen by Dr. Izell [place] at [facility], has probable connective tissue defect though not defineable.  She also has a history of dyslipidemia I saw her in clinic last in 2013 She is active  Plays doubles tennis.  Denies SOB  Notes occasional chest pains with and without activity  Intermittent.   Denies dizziness.   No Known Allergies  Current Outpatient Prescriptions  Medication Sig Dispense Refill  . Cholecalciferol (VITAMIN D PO) Take by mouth. On  occasion       No current facility-administered medications for this visit.    No past medical history on file.  No past surgical history on file.  No family history on file.  History   Social History  . Marital Status: Married    Spouse Name: N/A    Number of Children: N/A  . Years of Education: N/A   Occupational History  . Not on file.   Social History Main Topics  . Smoking status: Never Smoker   . Smokeless tobacco: Not on file  . Alcohol Use: Not on file  . Drug Use: Not on file  . Sexual Activity: Not on file   Other Topics Concern  . Not on file   Social History Narrative   The patient is married with two children.Does not smoke -Drinks Occasionally.    Review of Systems:  All systems reviewed.  They are negative to the above problem except as previously stated.  Vital Signs: BP 105/50  Pulse 53  Ht 5\' 6"  (1.676 m)  Wt 137 lb (62.143 kg)  BMI 22.12 kg/m2  BP  120/  Physical Exam Patient is in NAD HEENT:  Normocephalic, atraumatic. EOMI, PERRLA.  Neck: JVP is normal. No thyromegaly. No bruits.  Lungs: clear to auscultation. No rales no wheezes.  Heart: Regular rate and rhythm. Normal S1, S2. No S3.   No significant murmurs. PMI not displaced.  Abdomen:  Supple, nontender. Normal bowel sounds. No masses. No hepatomegaly.  Extremities:   Good distal pulses throughout. No lower extremity edema.   Musculoskeletal :moving all extremities.  Neuro:   alert and oriented x3.  CN II-XII grossly intact  EKG:  Sinus bradycardia  53 bpm.  Septal MI.  Assessment and Plan:  1.  SCAD  Will review records from [place].  QUestion further testing for new markers.   Would consider echo to evaluate root BP would limit meds.  Note she appears to be off of ASA   2.  HL  Will get records from gynecology re fasting lipids  No recomm for now  Will contact her.

## 2013-01-30 ENCOUNTER — Ambulatory Visit: Payer: BC Managed Care – PPO

## 2013-01-31 ENCOUNTER — Ambulatory Visit
Admission: RE | Admit: 2013-01-31 | Discharge: 2013-01-31 | Disposition: A | Discharge status: Home / Self Care | Payer: BC Managed Care – PPO | Source: Ambulatory Visit

## 2013-01-31 DIAGNOSIS — Z1231 Encounter for screening mammogram for malignant neoplasm of breast: Secondary | ICD-10-CM

## 2013-04-12 ENCOUNTER — Encounter: Payer: Self-pay | Admitting: Nurse Practitioner

## 2013-04-12 ENCOUNTER — Ambulatory Visit (INDEPENDENT_AMBULATORY_CARE_PROVIDER_SITE_OTHER): Payer: BC Managed Care – PPO | Admitting: Nurse Practitioner

## 2013-04-12 VITALS — BP 115/69 | HR 60 | Ht 65.25 in | Wt 134.0 lb

## 2013-04-12 DIAGNOSIS — R82998 Other abnormal findings in urine: Secondary | ICD-10-CM

## 2013-04-12 DIAGNOSIS — Z01419 Encounter for gynecological examination (general) (routine) without abnormal findings: Secondary | ICD-10-CM

## 2013-04-12 DIAGNOSIS — Z0000 Encounter for general adult medical examination without abnormal findings: Secondary | ICD-10-CM

## 2013-04-12 DIAGNOSIS — R8290 Unspecified abnormal findings in urine: Secondary | ICD-10-CM

## 2013-04-12 LAB — POCT URINALYSIS DIPSTICK
Bilirubin, UA: NEGATIVE
Blood, UA: NEGATIVE
Glucose, UA: NEGATIVE
Ketones, UA: NEGATIVE
Nitrite, UA: NEGATIVE
Protein, UA: NEGATIVE
Urobilinogen, UA: NEGATIVE
pH, UA: 6

## 2013-04-12 NOTE — Patient Instructions (Addendum)

## 2013-04-12 NOTE — Progress Notes (Signed)
Patient ID: Melanie Cook, female   DOB: [birth year], 53 y.o.   MRN: [id number] 53 y.o. G8P2002 Married Caucasian Fe here for annual exam.  No vaso symptoms, no menses in 6 weeks.   Patient's last menstrual period was 02/28/2013.          Sexually active: yes  The current method of family planning is none.    Exercising: yes  Gym/ health club routine includes tennis 2 times per week, walking 3 times per week. Smoker:  no  Health Maintenance: Pap:  03/24/12, WNL, neg HR HPV MMG:  01/31/13, Bi-Rads 1: negative Colonoscopy:  never BMD:  never TDaP:  03/23/12 Labs:  Would like fasting at later date  Urine: 1+ leuk's   reports that she has never smoked. She has never used smokeless tobacco. She reports that she does not drink alcohol or use illicit drugs.  Past Medical History  Diagnosis Date  . Heart disease 2008    w/LAD and 3 stents  . 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 Cardiac surgery  06/2006    stents x 3    Current Outpatient Prescriptions  Medication Sig Dispense Refill  . Cholecalciferol (VITAMIN D PO) Take by mouth. On  occasion       No current facility-administered medications for this visit.    Family History  Problem Relation Age of Onset  . Heart disease Mother   . Heart disease Father     Psychologist, forensic  . Heart disease Brother     ROS:  Pertinent items are noted in HPI.  Otherwise, a comprehensive ROS was negative.  Exam:   BP 115/69  Pulse 60  Ht 5' 5.25" (1.657 m)  Wt 134 lb (60.782 kg)  BMI 22.14 kg/m2  LMP 02/28/2013 Height: 5' 5.25" (165.7 cm)  Ht Readings from Last 3 Encounters:  04/12/13 5' 5.25" (1.657 m)  01/26/13 5\' 6"  (1.676 m)  07/13/11 5\' 6"  (1.676 m)    General appearance: alert, cooperative and appears stated age Head: Normocephalic, without obvious abnormality, atraumatic Neck: no adenopathy, supple, symmetrical, trachea midline and thyroid normal to  inspection and palpation Lungs: clear to auscultation bilaterally Breasts: normal appearance, no masses or tenderness Heart: regular rate and rhythm Abdomen: soft, non-tender; no masses,  no organomegaly Extremities: extremities normal, atraumatic, no cyanosis or edema Skin: Skin color, texture, turgor normal. No rashes or lesions Lymph nodes: Cervical, supraclavicular, and axillary nodes normal. No abnormal inguinal nodes palpated Neurologic: Grossly normal   Pelvic: External genitalia:  no lesions              Urethra:  normal appearing urethra with no masses, tenderness or lesions              Bartholin's and Skene's: normal                 Vagina: normal appearing vagina with normal color and discharge, no lesions              Cervix: anteverted              Pap taken: no Bimanual Exam:  Uterus:  normal size, contour, position, consistency, mobility, non-tender              Adnexa: no mass, fullness, tenderness               Rectovaginal: Confirms  Anus:  normal sphincter tone, no lesions  A:  Well Woman with normal exam  Perimenopausal with current amenorrhea X 6 wk's.  History of Coronary Heart Disease with stents X 3   R/O UTI - asymptomatic    P:   Pap smear as per guidelines   Mammogram due 10/ 2015  Will check with insurance about coverage for colonoscopy and where she can go that is less expensive - gave her Dr. Lorie Apley name  Will return for fasting labs  Will follow up on urine C&S  Will CB if no menses in 4 months  Counseled on breast self exam, mammography screening, adequate intake of calcium and vitamin D, diet and exercise, Kegel's exercises return annually or prn  An After Visit Summary was printed and given to the patient.

## 2013-04-14 LAB — URINE CULTURE
Colony Count: NO GROWTH
Organism ID, Bacteria: NO GROWTH

## 2013-04-16 NOTE — Progress Notes (Signed)
Encounter reviewed by Dr. Warrick Llera Silva.  

## 2013-04-17 ENCOUNTER — Other Ambulatory Visit (INDEPENDENT_AMBULATORY_CARE_PROVIDER_SITE_OTHER): Payer: BC Managed Care – PPO

## 2013-04-17 DIAGNOSIS — Z0000 Encounter for general adult medical examination without abnormal findings: Secondary | ICD-10-CM

## 2013-04-17 LAB — LIPID PANEL
Cholesterol: 203 mg/dL — ABNORMAL HIGH (ref 0–200)
HDL: 57 mg/dL (ref >39)
LDL Cholesterol: 137 mg/dL — ABNORMAL HIGH (ref 0–99)
Total CHOL/HDL Ratio: 3.6 Ratio
Triglycerides: 46 mg/dL (ref <150)
VLDL: 9 mg/dL (ref 0–40)

## 2013-04-17 LAB — TSH: TSH: 1.758 u[IU]/mL (ref 0.350–4.500)

## 2013-04-17 LAB — COMPREHENSIVE METABOLIC PANEL
ALT: 14 U/L (ref 0–35)
AST: 22 U/L (ref 0–37)
Albumin: 4.5 g/dL (ref 3.5–5.2)
Alkaline Phosphatase: 48 U/L (ref 39–117)
BUN: 10 mg/dL (ref 6–23)
CO2: 30 mEq/L (ref 19–32)
Calcium: 9.1 mg/dL (ref 8.4–10.5)
Chloride: 105 mEq/L (ref 96–112)
Creat: 0.7 mg/dL (ref 0.50–1.10)
Glucose, Bld: 84 mg/dL (ref 70–99)
Potassium: 4.7 mEq/L (ref 3.5–5.3)
Sodium: 140 mEq/L (ref 135–145)
Total Bilirubin: 0.6 mg/dL (ref 0.3–1.2)
Total Protein: 6.7 g/dL (ref 6.0–8.3)

## 2013-04-18 LAB — VITAMIN D 25 HYDROXY (VIT D DEFICIENCY, FRACTURES): Vit D, 25-Hydroxy: 27 ng/mL — ABNORMAL LOW (ref 30–89)

## 2013-10-09 ENCOUNTER — Telehealth: Payer: Self-pay | Admitting: Nurse Practitioner

## 2013-10-09 ENCOUNTER — Other Ambulatory Visit: Payer: Self-pay | Admitting: Nurse Practitioner

## 2013-10-09 DIAGNOSIS — Z1211 Encounter for screening for malignant neoplasm of colon: Secondary | ICD-10-CM

## 2013-10-09 NOTE — Telephone Encounter (Signed)
Pt is having a problem and would like a referral sent to Dr Earle Gell over at [facility] for a colonoscopy. Their number is [phone] and fax number is [phone].

## 2013-10-09 NOTE — Telephone Encounter (Signed)
Order is placed for the referral

## 2013-10-09 NOTE — Telephone Encounter (Signed)
Melanie Cage, FNP patient would like referral to Dr.Martin [facility] for colonoscopy. Okay to place at this time?

## 2013-10-09 NOTE — Telephone Encounter (Signed)
Left message to call [place] at [phone].  Advise patient referral placed to Dr.Martin [facility] for colonoscopy.

## 2013-10-09 NOTE — Telephone Encounter (Signed)
Spoke with patient. Advised referral placed. Patient states that she will call their office to get scheduled as she is wanting to be seen ASAP. Advised if she needs anything further from our office to give [place] a call back. Patient agreeable.  Routing to provider for final review. Patient agreeable to disposition. Will close encounter

## 2013-11-08 ENCOUNTER — Other Ambulatory Visit: Payer: Self-pay | Admitting: Gastroenterology

## 2013-12-21 ENCOUNTER — Other Ambulatory Visit: Payer: Self-pay | Admitting: *Deleted

## 2013-12-21 DIAGNOSIS — E785 Hyperlipidemia, unspecified: Secondary | ICD-10-CM

## 2013-12-22 ENCOUNTER — Telehealth: Payer: Self-pay | Admitting: *Deleted

## 2013-12-22 ENCOUNTER — Encounter (HOSPITAL_COMMUNITY): Payer: Self-pay | Admitting: *Deleted

## 2013-12-22 ENCOUNTER — Encounter (HOSPITAL_COMMUNITY): Payer: Self-pay | Admitting: Pharmacy Technician

## 2013-12-22 NOTE — Telephone Encounter (Signed)
Per Dr. Harrington Challenger, placed order for vascuscreen (carotids/aorta). Message sent to [facility] for scheduling. Called patient to inform.

## 2013-12-25 ENCOUNTER — Other Ambulatory Visit: Payer: Self-pay | Admitting: Gastroenterology

## 2013-12-25 NOTE — Anesthesia Preprocedure Evaluation (Signed)
Anesthesia Evaluation  Patient identified by MRN, date of birth, ID band Patient awake    Reviewed: Allergy & Precautions, H&P , NPO status , Patient's Chart, lab work & pertinent test results  Airway       Dental   Pulmonary          Cardiovascular CAD: angioplasty in past.     Neuro/Psych    GI/Hepatic   Endo/Other    Renal/GU      Musculoskeletal   Abdominal   Peds  Hematology   Anesthesia Other Findings   Reproductive/Obstetrics                           Anesthesia Physical Anesthesia Plan  ASA: III  Anesthesia Plan: MAC   Post-op Pain Management:    Induction: Intravenous  Airway Management Planned: Nasal Cannula  Additional Equipment:   Intra-op Plan:   Post-operative Plan:   Informed Consent: I have reviewed the patients History and Physical, chart, labs and discussed the procedure including the risks, benefits and alternatives for the proposed anesthesia with the patient or authorized representative who has indicated his/her understanding and acceptance.     Plan Discussed with:   Anesthesia Plan Comments: (Need date and details about angioplasty)        Anesthesia Quick Evaluation

## 2013-12-26 ENCOUNTER — Encounter (HOSPITAL_COMMUNITY): Payer: BC Managed Care – PPO | Admitting: Anesthesiology

## 2013-12-26 ENCOUNTER — Ambulatory Visit (HOSPITAL_COMMUNITY): Payer: BC Managed Care – PPO | Admitting: Anesthesiology

## 2013-12-26 ENCOUNTER — Ambulatory Visit (HOSPITAL_COMMUNITY)
Admission: RE | Admit: 2013-12-26 | Discharge: 2013-12-26 | Disposition: A | Discharge status: Home / Self Care | Payer: BC Managed Care – PPO | Source: Ambulatory Visit | Attending: Gastroenterology | Admitting: Gastroenterology

## 2013-12-26 ENCOUNTER — Encounter (HOSPITAL_COMMUNITY)
Admission: RE | Disposition: A | Discharge status: Home / Self Care | Payer: Self-pay | Source: Ambulatory Visit | Attending: Gastroenterology

## 2013-12-26 ENCOUNTER — Encounter (HOSPITAL_COMMUNITY): Payer: Self-pay | Admitting: *Deleted

## 2013-12-26 DIAGNOSIS — C20 Malignant neoplasm of rectum: Secondary | ICD-10-CM | POA: Diagnosis not present

## 2013-12-26 DIAGNOSIS — K921 Melena: Secondary | ICD-10-CM | POA: Diagnosis present

## 2013-12-26 DIAGNOSIS — E7800 Pure hypercholesterolemia, unspecified: Secondary | ICD-10-CM | POA: Insufficient documentation

## 2013-12-26 HISTORY — DX: Nausea with vomiting, unspecified: R11.2

## 2013-12-26 HISTORY — PX: COLONOSCOPY WITH PROPOFOL: SHX5780

## 2013-12-26 HISTORY — DX: Other specified postprocedural states: Z98.890

## 2013-12-26 SURGERY — COLONOSCOPY WITH PROPOFOL
Anesthesia: Monitor Anesthesia Care

## 2013-12-26 MED ORDER — SODIUM CHLORIDE 0.9 % IV SOLN: INTRAVENOUS | Status: DC

## 2013-12-26 MED ORDER — PROPOFOL INFUSION 10 MG/ML OPTIME
INTRAVENOUS | Status: DC | PRN
  Administered 2013-12-26: 100 ug/kg/min via INTRAVENOUS

## 2013-12-26 MED ORDER — FENTANYL CITRATE 0.05 MG/ML IJ SOLN: 25.0000 ug | INTRAMUSCULAR | Status: DC | PRN

## 2013-12-26 MED ORDER — PROPOFOL 10 MG/ML IV BOLUS
INTRAVENOUS | Status: AC
  Filled 2013-12-26: qty 20

## 2013-12-26 MED ORDER — PROMETHAZINE HCL 25 MG/ML IJ SOLN: 6.2500 mg | INTRAMUSCULAR | Status: DC | PRN

## 2013-12-26 MED ORDER — LACTATED RINGERS IV SOLN
INTRAVENOUS | Status: DC
Start: 2013-12-26 — End: 2013-12-26
  Administered 2013-12-26: 1000 mL via INTRAVENOUS

## 2013-12-26 MED ORDER — LACTATED RINGERS IV SOLN
INTRAVENOUS | Status: DC | PRN
  Administered 2013-12-26: 11:00:00 via INTRAVENOUS

## 2013-12-26 MED ORDER — MEPERIDINE HCL 100 MG/ML IJ SOLN: 6.2500 mg | INTRAMUSCULAR | Status: DC | PRN

## 2013-12-26 MED ORDER — PROPOFOL 10 MG/ML IV BOLUS
INTRAVENOUS | Status: DC | PRN
  Administered 2013-12-26: 70 mg via INTRAVENOUS
  Administered 2013-12-26: 30 mg via INTRAVENOUS
  Administered 2013-12-26: 10 mg via INTRAVENOUS
  Administered 2013-12-26 (×3): 30 mg via INTRAVENOUS

## 2013-12-26 MED ORDER — LIDOCAINE HCL (CARDIAC) 20 MG/ML IV SOLN
INTRAVENOUS | Status: DC | PRN
  Administered 2013-12-26: 50 mg via INTRAVENOUS

## 2013-12-26 SURGICAL SUPPLY — 22 items

## 2013-12-26 NOTE — Transfer of Care (Signed)
Immediate Anesthesia Transfer of Care Note  Patient: Melanie Cook  Procedure(s) Performed: Procedure(s) (LRB): COLONOSCOPY WITH PROPOFOL (N/A)  Patient Location: PACU  Anesthesia Type: MAC  Level of Consciousness: sedated, patient cooperative and responds to stimulation  Airway & Oxygen Therapy: Patient Spontanous Breathing and Patient connected to face mask oxgen  Post-op Assessment: Report given to PACU RN and Post -op Vital signs reviewed and stable  Post vital signs: Reviewed and stable  Complications: No apparent anesthesia complications

## 2013-12-26 NOTE — Op Note (Signed)
Problem: Hematochezia  Endoscopist: Earle Gell  Premedication: Propofol administered by anesthesia  Procedure: The patient was placed in the left lateral decubitus position. Anal inspection and digital rectal exam were normal. The Pentax pediatric colonoscope was introduced into the rectum and advanced to the cecum. A normal-appearing appendiceal orifice was identified. A normal-appearing ileocecal valve was intubated and the terminal ileum inspected. Colonic preparation for the exam today was good.  Rectum. In the distal rectum adherent to the distal rectal fold was a 5 cm sessile cauliflower-appearing polyp with ulceration and friability. The polyp was debulked by 50% using the electrocautery snare. The remaining polyp was snugly adherent to the distal rectal fold and was not limited. The debulked tissue was retrieved with the Jabier Mutton and sent for pathological evaluation. Retroflexed view of the distal rectum was normal. The polypoid lesion was close to the dentate line.  Sigmoid colon and descending colon. Normal  Splenic flexure. Normal  Transverse colon. Normal.  Hepatic flexure. Normal  Ascending colon. Normal  Cecum and ileocecal valve. Normal  Terminal ileum. Normal  Assessment: A 5 cm sessile distal rectal polyp snugly appeared to the distal rectal fold was debulked by 50% using the electrocautery snare. The colonoscopy with inspection of the terminal ileum was otherwise normal.  Plan: I am concerned that the polypoid mass contains adenocarcinoma and will require surgical removal. I will await the pathology.

## 2013-12-26 NOTE — Anesthesia Postprocedure Evaluation (Signed)
  Anesthesia Post-op Note  Patient: Melanie Cook  Procedure(s) Performed: Procedure(s): COLONOSCOPY WITH PROPOFOL (N/A)  Patient Location: PACU  Anesthesia Type:MAC  Level of Consciousness: awake, alert  and oriented  Airway and Oxygen Therapy: Patient Spontanous Breathing  Post-op Pain: none  Post-op Assessment: Post-op Vital signs reviewed, Patient's Cardiovascular Status Stable, Respiratory Function Stable, Patent Airway and No signs of Nausea or vomiting  Post-op Vital Signs: Reviewed and stable  Last Vitals:  Filed Vitals:   12/26/13 1350  BP: 135/78  Pulse: 55  Temp:   Resp: 14    Complications: No apparent anesthesia complications

## 2013-12-26 NOTE — H&P (Signed)
  Procedure: Baseline screening colonoscopy. Intermittent small-volume hematochezia  History: The patient is a 53 year old female born 1961-02-26. She is scheduled to undergo her first screening colonoscopy with polypectomy to prevent colon cancer. There is no family history of colon cancer.  Intermittently, the patient passes a small amount of fresh blood with her otherwise normal bowel movements. She does not take nonsteroidal anti-inflammatory medication.  Allergies: Latex  Family history: Brother has Marfan syndrome  Past medical history: Coronary artery dissection. Coronary artery stents placed. Hypercholesterolemia.  Exam: The patient is alert and lying comfortably on the endoscopy stretcher. Abdomen is soft and nontender to palpation. Lungs are clear to auscultation. Cardiac exam reveals a regular rhythm.  Plan: Proceed with scheduled colonoscopy.

## 2013-12-27 ENCOUNTER — Encounter (HOSPITAL_COMMUNITY): Payer: Self-pay | Admitting: Gastroenterology

## 2013-12-28 ENCOUNTER — Other Ambulatory Visit: Payer: Self-pay

## 2013-12-28 ENCOUNTER — Other Ambulatory Visit: Payer: Self-pay | Admitting: Gastroenterology

## 2013-12-28 DIAGNOSIS — C20 Malignant neoplasm of rectum: Secondary | ICD-10-CM

## 2013-12-28 DIAGNOSIS — Z1231 Encounter for screening mammogram for malignant neoplasm of breast: Secondary | ICD-10-CM

## 2013-12-29 ENCOUNTER — Other Ambulatory Visit (HOSPITAL_COMMUNITY): Payer: Self-pay | Admitting: *Deleted

## 2013-12-29 ENCOUNTER — Ambulatory Visit
Admission: RE | Admit: 2013-12-29 | Discharge: 2013-12-29 | Disposition: A | Discharge status: Home / Self Care | Payer: BC Managed Care – PPO | Source: Ambulatory Visit | Attending: Gastroenterology | Admitting: Gastroenterology

## 2013-12-29 DIAGNOSIS — Z139 Encounter for screening, unspecified: Secondary | ICD-10-CM

## 2013-12-29 DIAGNOSIS — C20 Malignant neoplasm of rectum: Secondary | ICD-10-CM

## 2013-12-29 MED ORDER — IOHEXOL 300 MG/ML  SOLN
100.0000 mL | Freq: Once | INTRAMUSCULAR | Status: AC | PRN
Start: 2013-12-29 — End: 2013-12-29
  Administered 2013-12-29: 100 mL via INTRAVENOUS

## 2014-01-01 ENCOUNTER — Other Ambulatory Visit: Payer: Self-pay | Admitting: Gastroenterology

## 2014-01-01 NOTE — Addendum Note (Signed)
Addended by: Huey Scalia on: 01/01/2014 06:30 PM   Modules accepted: Orders  

## 2014-01-03 ENCOUNTER — Encounter (HOSPITAL_COMMUNITY): Payer: Self-pay | Admitting: *Deleted

## 2014-01-03 ENCOUNTER — Encounter (HOSPITAL_COMMUNITY)
Admission: RE | Disposition: A | Discharge status: Home / Self Care | Payer: Self-pay | Source: Ambulatory Visit | Attending: Gastroenterology

## 2014-01-03 ENCOUNTER — Ambulatory Visit (HOSPITAL_COMMUNITY)
Admission: RE | Admit: 2014-01-03 | Discharge: 2014-01-03 | Disposition: A | Discharge status: Home / Self Care | Payer: BC Managed Care – PPO | Source: Ambulatory Visit | Attending: Gastroenterology | Admitting: Gastroenterology

## 2014-01-03 DIAGNOSIS — I2510 Atherosclerotic heart disease of native coronary artery without angina pectoris: Secondary | ICD-10-CM | POA: Diagnosis not present

## 2014-01-03 DIAGNOSIS — E7800 Pure hypercholesterolemia, unspecified: Secondary | ICD-10-CM | POA: Insufficient documentation

## 2014-01-03 DIAGNOSIS — C20 Malignant neoplasm of rectum: Secondary | ICD-10-CM | POA: Insufficient documentation

## 2014-01-03 DIAGNOSIS — Z9861 Coronary angioplasty status: Secondary | ICD-10-CM | POA: Insufficient documentation

## 2014-01-03 HISTORY — DX: Atherosclerotic heart disease of native coronary artery without angina pectoris: I25.10

## 2014-01-03 HISTORY — PX: EUS: SHX5427

## 2014-01-03 SURGERY — ULTRASOUND, LOWER GI TRACT, ENDOSCOPIC
Anesthesia: Moderate Sedation

## 2014-01-03 MED ORDER — SPOT INK MARKER SYRINGE KIT
PACK | SUBMUCOSAL | Status: AC
  Filled 2014-01-03: qty 5

## 2014-01-03 MED ORDER — MIDAZOLAM HCL 10 MG/2ML IJ SOLN
INTRAMUSCULAR | Status: AC
  Filled 2014-01-03: qty 2

## 2014-01-03 MED ORDER — FENTANYL CITRATE 0.05 MG/ML IJ SOLN
INTRAMUSCULAR | Status: AC
  Filled 2014-01-03: qty 2

## 2014-01-03 MED ORDER — SODIUM CHLORIDE 0.9 % IV SOLN
INTRAVENOUS | Status: DC
  Administered 2014-01-03: 500 mL via INTRAVENOUS

## 2014-01-03 MED ORDER — FENTANYL CITRATE 0.05 MG/ML IJ SOLN
INTRAMUSCULAR | Status: DC | PRN
  Administered 2014-01-03 (×2): 25 ug via INTRAVENOUS

## 2014-01-03 MED ORDER — MIDAZOLAM HCL 10 MG/2ML IJ SOLN
INTRAMUSCULAR | Status: DC | PRN
  Administered 2014-01-03 (×3): 2 mg via INTRAVENOUS

## 2014-01-03 MED ORDER — SPOT INK MARKER SYRINGE KIT
PACK | SUBMUCOSAL | Status: DC | PRN
  Administered 2014-01-03: 3 mL via SUBMUCOSAL

## 2014-01-03 NOTE — Discharge Instructions (Signed)
Endorectal ultrasound ° °Post procedure instructions: ° °Read the instructions outlined below and refer to this sheet in the next few weeks. These discharge instructions provide you with general information on caring for yourself after you leave the hospital. Your doctor may also give you specific instructions. While your treatment has been planned according to the most current medical practices available, unavoidable complications occasionally occur. If you have any problems or questions after discharge, call Dr. Outlaw at [facility] ([phone]). ° °HOME CARE INSTRUCTIONS ° °ACTIVITY: °· You may resume your regular activity, but move at a slower pace for the next 24 hours.  °· Take frequent rest periods for the next 24 hours.  °· Walking will help get rid of the air and reduce the bloated feeling in your belly (abdomen).  °· No driving for 24 hours (because of the medicine (anesthesia) used during the test).  °· You may shower.  °· Do not sign any important legal documents or operate any machinery for 24 hours (because of the anesthesia used during the test).  °NUTRITION: °· Drink plenty of fluids.  °· You may resume your normal diet as instructed by your doctor.  °· Begin with a light meal and progress to your normal diet. Heavy or fried foods are harder to digest and may make you feel sick to your stomach (nauseated).  °· Avoid alcoholic beverages for 24 hours or as instructed.  °MEDICATIONS: °· You may resume your normal medications unless your doctor tells you otherwise.  °WHAT TO EXPECT TODAY: °· Some feelings of bloating in the abdomen.  °· Passage of more gas than usual.  °· Spotting of blood in your stool or on the toilet paper.  °IF YOU HAD POLYPS REMOVED DURING THE COLONOSCOPY: °· No aspirin products for 7 days or as instructed.  °· No alcohol for 7 days or as instructed.  °· Eat a soft diet for the next 24 hours.  ° °FINDING OUT THE RESULTS OF YOUR TEST ° °Not all test results are available  during your visit. If your test results are not back during the visit, make an appointment with your caregiver to find out the results. Do not assume everything is normal if you have not heard from your caregiver or the medical facility. It is important for you to follow up on all of your test results.  ° ° ° °SEEK IMMEDIATE MEDICAL CARE IF: ° °· You have more than a spotting of blood in your stool.  °· Your belly is swollen (abdominal distention).  °· You are nauseated or vomiting.  °· You have a fever.  °· You have abdominal pain or discomfort that is severe or gets worse throughout the day.  ° ° °Document Released: 11/05/2003 Document Revised: 12/03/2010 Document Reviewed: 11/03/2007 °ExitCare® Patient Information ©2012 [employer]. ° °

## 2014-01-03 NOTE — Interval H&P Note (Signed)
History and Physical Interval Note:  01/03/2014 7:40 AM  Melanie Cook  has presented today for surgery, with the diagnosis of rectal cancer  The various methods of treatment have been discussed with the patient and family. After consideration of risks, benefits and other options for treatment, the patient has consented to  Procedure(s): LOWER ENDOSCOPIC ULTRASOUND (EUS) (N/A) as a surgical intervention .  The patient's history has been reviewed, patient examined, no change in status, stable for surgery.  I have reviewed the patient's chart and labs.  Questions were answered to the patient's satisfaction.     Merrillyn Ackerley M  Assessment:  1.  Rectal cancer (adenocarcinoma).  Plan:  1.  Endorectal ultrasound for locoregional staging. 2.  Risks (bleeding, infection, bowel perforation that could require surgery, sedation-related changes in cardiopulmonary systems), benefits (identification and possible treatment of source of symptoms, exclusion of certain causes of symptoms), and alternatives (watchful waiting, radiographic imaging studies, empiric medical treatment) of endorectal ultrasound were explained to patient/family in detail and patient wishes to proceed.

## 2014-01-03 NOTE — H&P (View-Only) (Signed)
  Procedure: Baseline screening colonoscopy. Intermittent small-volume hematochezia  History: The patient is a 53 year old female born 18-Nov-1960. She is scheduled to undergo her first screening colonoscopy with polypectomy to prevent colon cancer. There is no family history of colon cancer.  Intermittently, the patient passes a small amount of fresh blood with her otherwise normal bowel movements. She does not take nonsteroidal anti-inflammatory medication.  Allergies: Latex  Family history: Brother has Marfan syndrome  Past medical history: Coronary artery dissection. Coronary artery stents placed. Hypercholesterolemia.  Exam: The patient is alert and lying comfortably on the endoscopy stretcher. Abdomen is soft and nontender to palpation. Lungs are clear to auscultation. Cardiac exam reveals a regular rhythm.  Plan: Proceed with scheduled colonoscopy.

## 2014-01-03 NOTE — Op Note (Signed)
[facility] [place], [id number]   LOWER ENDOSCOPIC ULTRASOUND PROCEDURE REPORT     EXAM DATE: 01/03/2014  PATIENT NAME:          Melanie Cook, Melanie Cook          MR#:        [id number]  BIRTHDATE:       December 02, 1960     VISIT #:     [phone] ATTENDING:     Arta Silence, MD     STATUS:     outpatient ASSISTANT:      Cleda Daub and George Hugh MD:  Earle Gell, M.D.  Lavone Orn, M.D. ASA CLASS:        Class I INDICATIONS:  The patient is a 53 yr old female here for a lower endoscopic ultrasound due to rectal cancer. PROCEDURE PERFORMED:     Flexible sigmoidoscopy EUS Flexible sigmoidoscopy with directed submucosal injection   MEDICATIONS:     Fentanyl 50 mcg IV and Versed 6 mg IV  CONSENT: The patient understands the risks and benefits of the procedure and understands that these risks include, but are not limited to: sedation, allergic reaction, infection, perforation and/or bleeding.. The patient elects to proceed with this endoscopic procedure.  DESCRIPTION OF PROCEDURE:  Informed was verified, confirmed and timeout was successfully executed by the treatment team. Under direct visualization, the radial forward-viewing echoendoscope followed by the diagnostic gastroscope  were sequentially introduced through the anus and advanced to the sigmoid colon. Water was used as necessary to provide an acoustic interface.  The scope was then completely withdrawn from the patient and the procedure terminated.  Tolerated procedure well.     FINDINGS:  Normal digital rectal exam.  Ulcerated friable lesion seen in mid-rectum from 8 cm to 12 cm from the anal verge.  Water was instilled into the rectum to facilitate acoustic coupling. There was some cautery artifact from prior partial polypectomy of this area, and the lesion is right along the rim of the distal rectal fold, which made en face views of the lesion challenging. As best I  could tell, lesion appears to invade, but does not penetrate, the muscularis propria.  The previously seen lymph node seen on CT was not seen on my study.  No lymph nodes seen.  3 cc of [place] Ink was injected submucosally about the lesion site for tattooing.  IMPRESSIONS:  As above.  Locoregional staging for this lesion is T2 N0 Mx by endorectal ultrasound.  RECOMMENDATIONS:     1.  Watch for potential complications of procedure. 2.  Joint surgical and oncology consultations; patient would likely benefit from PET scan versus MRI to better characterize the liver lesion seen on CT.  If liver lesion is not felt to represent metastasis, patient might be candidate for up-front surgical intervention.  ___________________________________ Arta Silence, MD eSigned:  Arta Silence, MD 01/03/2014 8:32 AM cc:

## 2014-01-04 ENCOUNTER — Encounter (HOSPITAL_COMMUNITY): Payer: Self-pay | Admitting: Gastroenterology

## 2014-01-04 ENCOUNTER — Other Ambulatory Visit: Payer: Self-pay | Admitting: Gastroenterology

## 2014-01-04 DIAGNOSIS — K769 Liver disease, unspecified: Secondary | ICD-10-CM

## 2014-01-05 ENCOUNTER — Telehealth: Payer: Self-pay | Admitting: Oncology

## 2014-01-05 NOTE — Telephone Encounter (Signed)
LEFT MESSAGE FOR PATIENT AND GAVE NP APPT FOR 10/08 @ 1:30 W/DR. SHERRILL LEFT CONTACT INFORMATION FOR PATIENT TO RETURN CALL TO CONFIRM MESSAGE/NP APPT.

## 2014-01-05 NOTE — Telephone Encounter (Signed)
C/D 01/05/14 for appt. 01/11/14

## 2014-01-08 ENCOUNTER — Ambulatory Visit (HOSPITAL_COMMUNITY): Payer: BC Managed Care – PPO | Attending: Internal Medicine | Admitting: *Deleted

## 2014-01-08 DIAGNOSIS — E785 Hyperlipidemia, unspecified: Secondary | ICD-10-CM

## 2014-01-08 NOTE — Progress Notes (Signed)
Vascular Screening Carotid Aorta Performed  Self Pay

## 2014-01-09 ENCOUNTER — Ambulatory Visit
Admission: RE | Admit: 2014-01-09 | Discharge: 2014-01-09 | Disposition: A | Discharge status: Home / Self Care | Payer: BC Managed Care – PPO | Source: Ambulatory Visit | Attending: Gastroenterology | Admitting: Gastroenterology

## 2014-01-09 DIAGNOSIS — K769 Liver disease, unspecified: Secondary | ICD-10-CM

## 2014-01-09 MED ORDER — GADOXETATE DISODIUM 0.25 MMOL/ML IV SOLN
6.0000 mL | Freq: Once | INTRAVENOUS | Status: AC | PRN
  Administered 2014-01-09: 6 mL via INTRAVENOUS

## 2014-01-11 ENCOUNTER — Ambulatory Visit (HOSPITAL_BASED_OUTPATIENT_CLINIC_OR_DEPARTMENT_OTHER): Payer: BC Managed Care – PPO | Admitting: Oncology

## 2014-01-11 ENCOUNTER — Encounter: Payer: Self-pay | Admitting: Oncology

## 2014-01-11 ENCOUNTER — Ambulatory Visit: Payer: BC Managed Care – PPO

## 2014-01-11 ENCOUNTER — Other Ambulatory Visit: Payer: Self-pay | Admitting: *Deleted

## 2014-01-11 ENCOUNTER — Telehealth: Payer: Self-pay | Admitting: Oncology

## 2014-01-11 ENCOUNTER — Telehealth: Payer: Self-pay | Admitting: *Deleted

## 2014-01-11 VITALS — BP 105/70 | HR 57 | Temp 98.5°F | Resp 18 | Ht 66.0 in | Wt 132.7 lb

## 2014-01-11 DIAGNOSIS — Z23 Encounter for immunization: Secondary | ICD-10-CM

## 2014-01-11 DIAGNOSIS — C20 Malignant neoplasm of rectum: Secondary | ICD-10-CM

## 2014-01-11 DIAGNOSIS — K769 Liver disease, unspecified: Secondary | ICD-10-CM

## 2014-01-11 DIAGNOSIS — I2510 Atherosclerotic heart disease of native coronary artery without angina pectoris: Secondary | ICD-10-CM

## 2014-01-11 MED ORDER — INFLUENZA VAC SPLIT QUAD 0.5 ML IM SUSY
0.5000 mL | PREFILLED_SYRINGE | Freq: Once | INTRAMUSCULAR | Status: AC
  Administered 2014-01-11: 0.5 mL via INTRAMUSCULAR
  Filled 2014-01-11: qty 0.5

## 2014-01-11 MED ORDER — CAPECITABINE 500 MG PO TABS: 1500.0000 mg | ORAL_TABLET | Freq: Two times a day (BID) | ORAL | Status: DC

## 2014-01-11 NOTE — Progress Notes (Signed)
Checked in new pt with no financial concerns at this time.  Pt has Raquel's card for any questions or concerns. ° °

## 2014-01-11 NOTE — Telephone Encounter (Signed)
Called pt to confirm she is aware of today's appt. No answer. Left message requesting she contact office.

## 2014-01-11 NOTE — Progress Notes (Signed)
[facility] Patient Consult   Referring MD: Ciclaly Mulcahey 53 y.o.  07-07-1960    Reason for Referral: Rectal cancer   HPI: She reports intermittent rectal bleeding for the past 6 months. No associated symptoms. She was referred to Dr. Wynetta Emery and was taken to a colonoscopy 12/26/2013. A 5 cm mass was noted in the distal rectum. This polyp was debulked by 50% and submitted for pathology. The colonoscopy was otherwise normal. The pathology ([id number]) confirmed invasive adenocarcinoma arising in a background of a tubular adenoma. No loss of mismatch repair protein expression.  CTs of the chest, abdomen, and pelvis 08/28/2013 revealed clear lungs. An indeterminate peripherally hyperattenuating lesion was noted in the dome of the liver measuring 1.2 cm. A 9 mm lymph node was noted adjacent to the rectosigmoid colon. No additional pathologically enlarged lymph nodes. The rectum was collapsed.  She was referred to Dr. Paulita Fujita and was taken to an endoscopic ultrasound procedure for staging on 01/03/2014. An ulcerated friable lesion was seen in the mid rectum from 8-12 cm from the anal verge. The lesion appeared to invade, but did not penetrate the muscularis propria. The lymph node seen on CT was not visible. No lymph nodes were seen. The area of the tumor was tattooed. The lesion was staged as a T2 N0 tumor by ultrasound. The lesion was along the grandmother distal rectal fold making the ultrasound "challenging ".  An MRI of the pelvis 01/10/2012 confirmed a mid to upper rectal tumor measuring 5.2 cm. The tumor measured approximately 5.2 cm from the anorectal junction. Tumor extended through the muscularis propria and a single 9 mm left perirectal lymph node was seen. No iliac or inguinal lymphadenopathy. The lesion was staged as a T3 N1 tumor by MRI.  She also had an abdominal MRI to evaluate the indeterminate liver lesion. This study has not been reported.  Past  Medical History  Diagnosis Date  . Heart disease 2008    w/LAD and 3 stents  . HSV infection since teens    cold sore - oral and vaginal  .  G2 P2    .  hyperlipidemia      Past Surgical History  Procedure Laterality Date  . Cardiac surgery  06/2006    stents x 3  . Coronary angioplasty      3 stents  . Colonoscopy with propofol N/A 12/26/2013    Procedure: COLONOSCOPY WITH PROPOFOL;  Surgeon: Garlan Fair, MD;  Location: [facility] END[facility]COPY;  Service: Endoscopy;  Laterality: N/A;  . Eus N/A 01/03/2014    Procedure: LOWER ENDOSCOPIC ULTRASOUND (EUS);  Surgeon: Arta Silence, MD;  Location: [facility];  Service: Endoscopy;  Laterality: N/A;    Medications: Reviewed  Allergies:  Allergies  Allergen Reactions  . Latex Rash    Family history: 3 brothers and 2 sisters. No family history of cancer.  Social History:   She lives in [place]. She works in [employer]. She does not use tobacco or alcohol. No transfusion history. No risk factor for HIV or hepatitis.   ROS:   Positives include: Intermittent rectal bleeding for 6 months, irregular bowel habits, anxiety after being diagnosed with rectal cancer  A complete ROS was otherwise negative.  Physical Exam:  Blood pressure 105/70, pulse 57, temperature 98.5 F (36.9 C), temperature source Oral, resp. rate 18, height '5\' 6"'  (1.676 m), weight 132 lb 11.2 oz (60.192 kg), last menstrual period 12/16/2013, SpO2 100.00%.  HEENT: Oropharynx without  visible mass, neck without mass Lungs: Clear bilaterally Cardiac: Regular rate and rhythm Abdomen: No hepatomegaly, nontender, no mass  Vascular: No leg edema Lymph nodes: No cervical, supraclavicular, axillary, or inguinal nodes Neurologic: Alert and oriented, the motor exam appears intact in the upper and lower extremities Skin: No rash, resolving ecchymosis at the dorsum of the right hand Musculoskeletal: No spine tenderness   LAB:  CBC 12/28/2013 at [facility] medicine: Hemoglobin  13.2, white count 7.2, platelets 237,000, MCV 97.1, ANC 4.9  CMP   12/28/2013 at [facility] medicine: Creatinine 0.72, bilirubin 0.6, alkaline phosphatase face 49, AST 24, ALT 14 CEA 2.4 Imaging:  Pelvic MRI 01/09/2014 reviewed at the GI tumor conference 01/10/2014 12/29/2013 CT reviewed  Assessment/Plan:   1. Rectal cancer, clinical stage III (T3 N1) by pelvic MRI staging, EUS stage T2 N0  2. Indeterminate hepatic dome lesion on the abdomen CT 12/29/2013  3.   history of coronary artery disease, status post LAD dissection in 2008, status post PTCA/stent placement x3    Disposition:   Ms. Thatch has been diagnosed with rectal cancer. I discussed treatment options with her today. Her case was presented at the GI tumor conference on 01/10/2014. The consensus recommendation is to proceed with neoadjuvant chemotherapy and radiation based on the stage III disease by pelvic MRI.  I discussed the rationale behind neoadjuvant therapy with her. She is scheduled to see Dr. Lisbeth Renshaw 01/17/2014. I recommend radiation and concurrent capecitabine. We reviewed the potential toxicities associated with capecitabine including the chance for nausea, mucositis, alopecia, diarrhea, and hematologic toxicity. We discussed the hyperpigmentation, rash, and hand/foot syndrome associated with capecitabine. We also discussed the potential for skin breakdown with combined modality therapy. 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  She is scheduled to see Dr. Marcello Moores to discuss surgical options.  The anticipated start date for capecitabine and radiation is 01/29/2014. She is scheduled for an office visit 02/09/2014.Marland Kitchen  Approximately 50 minutes were spent with patient today. The majority of the time was used for counseling and coordination of care.  Stanley, Guion 01/11/2014, 2:17 PM

## 2014-01-11 NOTE — Telephone Encounter (Signed)
, °

## 2014-01-12 ENCOUNTER — Telehealth: Payer: Self-pay | Admitting: *Deleted

## 2014-01-12 NOTE — Telephone Encounter (Signed)
Called pt with MRI results. Liver lesion benign. She voiced understanding. Informed her that Xeloda Rx has been sent to [facility] pharmacy. Insurance required mail order pharmacy.

## 2014-01-15 ENCOUNTER — Other Ambulatory Visit: Payer: Self-pay | Admitting: *Deleted

## 2014-01-15 NOTE — Progress Notes (Signed)
GU Location of Tumor / Histology: Rectum 5cm mass distal     Melanie Cook presented  6  months ago with signs/symptoms of: rectal bleeding ,irregular bowel habirs,.   Biopsies of  Rectum : Diagnosis 12/26/28 Rectum, polyp(s), mass- INVASIVE ADENOCARCINOMA ARISING IN A BACKGROUND OF TUBULAR ADENOMA.  Past/Anticipated interventions by urology, if DHW:[id number] 12/26/13 Dr. August Saucer biopsied  Dr.  Arta Silence 01/03/14 EUS stage T2NO  Past/Anticipated interventions by medical oncology, if any: Dr. Benay Spice  01/10/14, Gi Conference, saw patient 01/11/14,  MRI  Staging III  (T3N1) referral to pelvic physical therapy, Xeloda with radiation scheduled to see Dr. Marcello Moores to discuss sugical options, follow up appt 02/09/14 with Dr.Sherrill  Weight changes, if any:   Bowel/Bladder complaints, if any:    Nausea/Vomiting, if any:   Pain issues, if any:    SAFETY ISSUES: No  Prior radiation? No  Pacemaker/ICD? NO  Possible current pregnancy? No  Is the patient on methotrexate?  No  Current Complaints / other details: , G2P2, works in [employer], no tobacco or alcohol use, no illicit drugs, Cardiac surgery 2008 x  PTCA 3 stents, , s/p LAD dissection 2008, no family hx cancer,

## 2014-01-16 ENCOUNTER — Encounter: Payer: Self-pay | Admitting: Radiation Oncology

## 2014-01-16 ENCOUNTER — Telehealth: Payer: Self-pay | Admitting: *Deleted

## 2014-01-16 ENCOUNTER — Telehealth: Payer: Self-pay | Admitting: Oncology

## 2014-01-16 ENCOUNTER — Other Ambulatory Visit: Payer: BC Managed Care – PPO

## 2014-01-16 NOTE — Telephone Encounter (Signed)
No other note. 

## 2014-01-17 ENCOUNTER — Ambulatory Visit: Payer: BC Managed Care – PPO

## 2014-01-17 ENCOUNTER — Ambulatory Visit
Admission: RE | Admit: 2014-01-17 | Discharge: 2014-01-17 | Disposition: A | Discharge status: Home / Self Care | Payer: BC Managed Care – PPO | Source: Ambulatory Visit | Attending: Radiation Oncology | Admitting: Radiation Oncology

## 2014-01-17 ENCOUNTER — Telehealth: Payer: Self-pay | Admitting: *Deleted

## 2014-01-17 HISTORY — DX: Malignant (primary) neoplasm, unspecified: C80.1

## 2014-01-17 HISTORY — DX: Allergy, unspecified, initial encounter: T78.40XA

## 2014-01-17 HISTORY — DX: Pure hypercholesterolemia, unspecified: E78.00

## 2014-01-17 NOTE — Telephone Encounter (Signed)
Received message from pt stating "I decided to have my surgery at [facility] and I'm just going to have everything done there"  Called and spoke with pt; she states that since she has decided to have surgery at [facility], she wants to just have radiation there and MD appt's there as well.  Clarified with pt that the Xeloda script that Dr. Benay Spice ordered will be cancelled with re: to decision and that Dr. Benay Spice understands and if she needs anything in the future; Dr. Benay Spice will be glad to see her.  Pt verbalized understanding of information and all future appts cancelled.  Spoke with [facility] and cancelled Xeloda script.

## 2014-01-22 ENCOUNTER — Other Ambulatory Visit: Payer: BC Managed Care – PPO

## 2014-01-23 ENCOUNTER — Ambulatory Visit: Payer: BC Managed Care – PPO | Admitting: Physical Therapy

## 2014-01-24 ENCOUNTER — Ambulatory Visit: Payer: BC Managed Care – PPO | Admitting: Physical Therapy

## 2014-01-29 ENCOUNTER — Encounter: Payer: Self-pay | Admitting: Oncology

## 2014-01-29 NOTE — Progress Notes (Signed)
No date for treatment

## 2014-02-01 ENCOUNTER — Ambulatory Visit: Payer: BC Managed Care – PPO

## 2014-02-01 ENCOUNTER — Telehealth: Payer: Self-pay | Admitting: *Deleted

## 2014-02-05 ENCOUNTER — Encounter: Payer: Self-pay | Admitting: Radiation Oncology

## 2014-02-09 ENCOUNTER — Other Ambulatory Visit: Payer: BC Managed Care – PPO

## 2014-02-09 ENCOUNTER — Ambulatory Visit: Payer: BC Managed Care – PPO | Admitting: Oncology

## 2014-04-19 ENCOUNTER — Telehealth: Payer: Self-pay | Admitting: Nurse Practitioner

## 2014-04-19 NOTE — Telephone Encounter (Signed)
Pt called to cancel/reschedule her aex. Now scheduled for 07/17/14 with [facility].  Pt asked for Miss Patty to call her regarding a recent diagnosis she would like to discuss with her.   Best: [phone]

## 2014-04-19 NOTE — Telephone Encounter (Signed)
Routing to [employer], FNP as patient would like to speak with her about recent diagnosis.

## 2014-04-19 NOTE — Telephone Encounter (Signed)
The cell and home number is the same - left her a message that I would be off tomorrow but would try to call here.

## 2014-04-23 NOTE — Telephone Encounter (Signed)
Patient calls about her recent diagnosis of rectal cancer stage III and is followed at [facility].  She had AEX on 04/12/13 and no mass on rectal exam.  The surgeon has showed he that this is just above where it could be felt.  She had onset of diarrhea and BRRB that initiated her to do GI evaluation. She then sought care at [facility].  She had treatment with radiation and chemo to shrink the tumor.  Surgery is planned next Wednesday on 04/25/14.  She will have a colostomy bag for 3 months with removal of the rectum and then a J pouch.  She is very optimistic about the surgery at [facility] and is quite pleased with the care.  Her apt. For here will need to be rescheduled after she is recovered.  Offered support and prayers.

## 2014-04-24 ENCOUNTER — Ambulatory Visit: Payer: BC Managed Care – PPO | Admitting: Nurse Practitioner

## 2014-04-25 NOTE — Telephone Encounter (Signed)
Pt has new AEX appt for 07/17/14.

## 2014-05-02 HISTORY — PX: OTHER SURGICAL HISTORY: SHX169

## 2014-07-17 ENCOUNTER — Ambulatory Visit: Payer: Self-pay | Admitting: Nurse Practitioner

## 2014-07-17 ENCOUNTER — Encounter: Payer: Self-pay | Admitting: Nurse Practitioner

## 2014-09-05 ENCOUNTER — Encounter: Payer: Self-pay | Admitting: Internal Medicine

## 2014-09-05 ENCOUNTER — Ambulatory Visit (INDEPENDENT_AMBULATORY_CARE_PROVIDER_SITE_OTHER): Payer: BLUE CROSS/BLUE SHIELD | Admitting: Internal Medicine

## 2014-09-05 VITALS — BP 108/74 | Temp 98.5°F | Ht 64.75 in | Wt 117.9 lb

## 2014-09-05 DIAGNOSIS — R29890 Loss of height: Secondary | ICD-10-CM | POA: Diagnosis not present

## 2014-09-05 DIAGNOSIS — Z8679 Personal history of other diseases of the circulatory system: Secondary | ICD-10-CM | POA: Diagnosis not present

## 2014-09-05 DIAGNOSIS — C20 Malignant neoplasm of rectum: Secondary | ICD-10-CM

## 2014-09-05 NOTE — Patient Instructions (Addendum)
Consider   Bone density  when possible.     Mediterranean diet.    Why follow it? Research shows. . Those who follow the Mediterranean diet have a reduced risk of heart disease  . The diet is associated with a reduced incidence of Parkinson's and Alzheimer's diseases . People following the diet may have longer life expectancies and lower rates of chronic diseases  . The Dietary Guidelines for Americans recommends the Mediterranean diet as an eating plan to promote health and prevent disease  What Is the Mediterranean Diet?  . Healthy eating plan based on typical foods and recipes of Mediterranean-style cooking . The diet is primarily a plant based diet; these foods should make up a majority of meals   Starches - Plant based foods should make up a majority of meals - They are an important sources of vitamins, minerals, energy, antioxidants, and fiber - Choose whole grains, foods high in fiber and minimally processed items  - Typical grain sources include wheat, oats, barley, corn, brown rice, bulgar, farro, millet, polenta, couscous  - Various types of beans include chickpeas, lentils, fava beans, black beans, white beans   Fruits  Veggies - Large quantities of antioxidant rich fruits & veggies; 6 or more servings  - Vegetables can be eaten raw or lightly drizzled with oil and cooked  - Vegetables common to the traditional Mediterranean Diet include: artichokes, arugula, beets, broccoli, brussel sprouts, cabbage, carrots, celery, collard greens, cucumbers, eggplant, kale, leeks, lemons, lettuce, mushrooms, okra, onions, peas, peppers, potatoes, pumpkin, radishes, rutabaga, shallots, spinach, sweet potatoes, turnips, zucchini - Fruits common to the Mediterranean Diet include: apples, apricots, avocados, cherries, clementines, dates, figs, grapefruits, grapes, melons, nectarines, oranges, peaches, pears, pomegranates, strawberries, tangerines  Fats - Replace butter and margarine with healthy  oils, such as olive oil, canola oil, and tahini  - Limit nuts to no more than a handful a day  - Nuts include walnuts, almonds, pecans, pistachios, pine nuts  - Limit or avoid candied, honey roasted or heavily salted nuts - Olives are central to the [employer] - can be eaten whole or used in a variety of dishes   Meats Protein - Limiting red meat: no more than a few times a month - When eating red meat: choose lean cuts and keep the portion to the size of deck of cards - Eggs: approx. 0 to 4 times a week  - Fish and lean poultry: at least 2 a week  - Healthy protein sources include, chicken, [place], lean beef, lamb - Increase intake of seafood such as tuna, salmon, trout, mackerel, shrimp, scallops - Avoid or limit high fat processed meats such as sausage and bacon  Dairy - Include moderate amounts of low fat dairy products  - Focus on healthy dairy such as fat free yogurt, skim milk, low or reduced fat cheese - Limit dairy products higher in fat such as whole or 2% milk, cheese, ice cream  Alcohol - Moderate amounts of red wine is ok  - No more than 5 oz daily for women (all ages) and men older than age 97  - No more than 10 oz of wine daily for men younger than 67  Other - Limit sweets and other desserts  - Use herbs and spices instead of salt to flavor foods  - Herbs and spices common to the traditional Mediterranean Diet include: basil, bay leaves, chives, cloves, cumin, fennel, garlic, lavender, marjoram, mint, oregano, parsley, pepper, rosemary, sage, savory, sumac, tarragon,  thyme   It's not just a diet, it's a lifestyle:  . The Mediterranean diet includes lifestyle factors typical of those in the region  . Foods, drinks and meals are best eaten with others and savored . Daily physical activity is important for overall good health . This could be strenuous exercise like running and aerobics . This could also be more leisurely activities such as walking, housework, yard-work,  or taking the stairs . Moderation is the key; a balanced and healthy diet accommodates most foods and drinks . Consider portion sizes and frequency of consumption of certain foods   Meal Ideas & Options:  . Breakfast:  o Whole wheat toast or whole wheat English muffins with peanut butter & hard boiled egg o Steel cut oats topped with apples & cinnamon and skim milk  o Fresh fruit: banana, strawberries, melon, berries, peaches  o Smoothies: strawberries, bananas, greek yogurt, peanut butter o Low fat greek yogurt with blueberries and granola  o Egg white omelet with spinach and mushrooms o Breakfast couscous: whole wheat couscous, apricots, skim milk, cranberries  . Sandwiches:  o Hummus and grilled vegetables (peppers, zucchini, squash) on whole wheat bread   o Grilled chicken on whole wheat pita with lettuce, tomatoes, cucumbers or tzatziki  o Tuna salad on whole wheat bread: tuna salad made with greek yogurt, olives, red peppers, capers, green onions o Garlic rosemary lamb pita: lamb sauted with garlic, rosemary, salt & pepper; add lettuce, cucumber, greek yogurt to pita - flavor with lemon juice and black pepper  . Seafood:  o Mediterranean grilled salmon, seasoned with garlic, basil, parsley, lemon juice and black pepper o Shrimp, lemon, and spinach whole-grain pasta salad made with low fat greek yogurt  o Seared scallops with lemon orzo  o Seared tuna steaks seasoned salt, pepper, coriander topped with tomato mixture of olives, tomatoes, olive oil, minced garlic, parsley, green onions and cappers  . Meats:  o Herbed greek chicken salad with kalamata olives, cucumber, feta  o Red bell peppers stuffed with spinach, bulgur, lean ground beef (or lentils) & topped with feta   o Kebabs: skewers of chicken, tomatoes, onions, zucchini, squash  o [place] burgers: made with red onions, mint, dill, lemon juice, feta cheese topped with roasted red peppers . Vegetarian o Cucumber salad:  cucumbers, artichoke hearts, celery, red onion, feta cheese, tossed in olive oil & lemon juice  o Hummus and whole grain pita points with a greek salad (lettuce, tomato, feta, olives, cucumbers, red onion) o Lentil soup with celery, carrots made with vegetable broth, garlic, salt and pepper  o Tabouli salad: parsley, bulgur, mint, scallions, cucumbers, tomato, radishes, lemon juice, olive oil, salt and pepper.

## 2014-09-05 NOTE — Progress Notes (Signed)
Pre visit review using our clinic review tool, if applicable. No additional management support is needed unless otherwise documented below in the visit note.  Chief Complaint  Patient presents with  . Establish Care    HPI: Patient  Melanie Cook  54 y.o. comes in today for New patient Health Care visit  [name]  Last seen by me 2006 . 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 She Is Followed by Dr. Harrington Challenger. Her Brother has Hotel manager Who Presented with a Dissection and Treatment and She Has Been Evaluated at the [facility] at [facility] and Negative for Marfan's.  In 2016 she had rectal surgery for rectal cancer diagnosed on routine colonoscopy that is felt to be curable she had resection has a temporary ileostomy treated at [facility] on chemotherapy and has a port. It appears that there will be a QRS that is has not spread and her markers are normal. During this time she hasn't been able to exercise like regular has some peeling and pain in her feet.   Ileostomy to be reversed in July .  Dr. Harrington Challenger advise she obtained PCP. She will continue to follow her from the cardiovascular area. She has noticed that her height has decreased 3/4-1 inch and a half over various measurements. Asks about osteoporosis negative family history but her mom died at age 68. No spontaneous fractures defined. Asks a bout cancer prevention diet or life styles   Health Maintenance  Topic Date Due  . HIV Screening  09/05/2015 (Originally 04/10/1975)  . INFLUENZA VACCINE  11/05/2014  . MAMMOGRAM  01/05/2016  . PAP SMEAR  01/05/2016  . TETANUS/TDAP  03/23/2022  . COLONOSCOPY  12/27/2023   Health Maintenance Review LIFESTYLE:  Exercise:  None currently under chemotherapy used to be very physically active good tennis player plans to go back after treatment Tobacco/ETS: No Alcohol: per day no Sugar beverages: No Sleep: Sleeps well 7 hours Drug  use: no Bone density: Not done Colonoscopy: As abov PAP: 2014 follow-up delayed because of her chemotherapy MAMMO: October 2015 last menstrual period October 2015 She is gravida 2 para 2 Think she is up-to-date on her tetanus shot.   ROS:  GEN/ HEENT: No fever, significant weight changes sweats headaches vision problems hearing changes, CV/ PULM; No chest pain shortness of breath cough, syncope,edema  change in exercise tolerance. GI /GU: No adominal pain, vomiting, change in bowel habits. No blood in the stool. No significant GU symptoms. SKIN/HEME: ,no acute skin rashes suspicious lesions or bleeding. No lymphadenopathy, nodules, masses.  NEURO/ PSYCH:  No neurologic signs such as weakness numbness. No depression anxiety. IMM/ Allergy: No unusual infections.  Allergy .   REST of 12 system review negative except as per HPI   Past Medical History  Diagnosis Date  . Heart disease 2008    w/LAD and 3 stents  . HSV infection since teens    cold sore - oral and vaginal  . PONV (postoperative nausea and vomiting)   . Coronary artery disease   . Allergy     latex  . Cancer 12/26/13 bx    rectalinvasive adenocarcinoma  . Hypercholesterolemia     Past Surgical History  Procedure Laterality Date  . Cardiac surgery  06/2006    stents x 3  . Coronary angioplasty      3 stents  . Colonoscopy with propofol N/A 12/26/2013    Procedure: COLONOSCOPY WITH PROPOFOL;  Surgeon: Garlan Fair, MD;  Location: [facility];  Service: Endoscopy;  Laterality: N/A;  . Eus N/A 01/03/2014    Procedure: LOWER ENDOSCOPIC ULTRASOUND (EUS);  Surgeon: Arta Silence, MD;  Location: [facility];  Service: Endoscopy;  Laterality: N/A;    Family History  Problem Relation Age of Onset  . Heart disease Mother   . Heart disease Father     Psychologist, forensic  . Heart disease Brother   . Marfan syndrome Brother     age 87 dissection    History   Social History  . Marital Status: Married    Spouse Name:  N/A  . Number of Children: 2  . Years of Education: N/A   Occupational History  .     Social History Main Topics  . Smoking status: Never Smoker   . Smokeless tobacco: Never Used  . Alcohol Use: No  . Drug Use: No  . Sexual Activity:    Partners: Male    Birth Control/ Protection: Surgical     Comment: vasectomy   Other Topics Concern  . None   Social History Narrative   The patient is married with two children.Does not smoke    7 hours of sleep per night   Lives with her husband. Both kids in college.(2)   2 dogs in the home.      Tennis  Pre cancer treatment.    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 capecitabine (XELODA) 500 MG tablet Take 3 tablets (1,500 mg total) by mouth 2 (two) times daily after a meal. Take on days of radiation only 84 tablet 0  . Cholecalciferol (VITAMIN D PO) Take 1 tablet by mouth. On  occasion     No facility-administered medications prior to visit.     EXAM:  BP 108/74 mmHg  Temp(Src) 98.5 F (36.9 C) (Oral)  Ht 5' 4.75" (1.645 m)  Wt 117 lb 14.4 oz (53.479 kg)  BMI 19.76 kg/m2  Body mass index is 19.76 kg/(m^2).  Physical Exam: Vital signs reviewed EYC:XKGY is a well-developed well-nourished alert cooperative    who appearsr stated age in no acute distress.  HEENT: normocephalic atraumatic , Eyes: PERRL EOM's full, conjunctiva clear, Nares: paten,t no deformity discharge or tenderness., Ears: no deformity EAC's clear TMs with normal landmarks. Mouth: clear OP, no lesions, edema.  Moist mucous membranes. Dentition in adequate repair. NECK: supple without masses, thyromegaly or bruits. CHEST/PULM:  Clear to auscultation and percussion breath sounds equal no wheeze , rales or rhonchi. Port ru chest CV: PMI is nondisplaced, S1 S2 no gallops, murmurs, rubs. Peripheral pulses are  full without delay.No JVD .  ABDOMEN: Bowel sounds normal nontender  Ostomy bag in place Extremtities:  No clubbing cyanosis or edema, no acute joint swelling or redness no focal atrophy NEURO:  Oriented x3, cranial nerves 3-12 appear to be intact, no obvious focal weakness,gait within normal limits  SKIN: No acute rashes normal turgor, color, no bruising or petechiae. feet not examined  PSYCH: Oriented, good eye contact, no obvious depression anxiety, cognition and judgment appear normal. LN: no cervical  adenopathy  Lab Results  Component Value Date   GLUCOSE 84 04/17/2013   CHOL 203* 04/17/2013   TRIG 46 04/17/2013  HDL 57 04/17/2013   LDLCALC 137* 04/17/2013   ALT 14 04/17/2013   AST 22 04/17/2013   NA 140 04/17/2013   K 4.7 04/17/2013   CL 105 04/17/2013   CREATININE 0.70 04/17/2013   BUN 10 04/17/2013   CO2 30 04/17/2013   TSH 1.758 04/17/2013   Last labs done at [facility] on care everywhere reviewed slight anemia normal blood sugar and kidney function. reviewed with patient  ASSESSMENT AND PLAN:  Discussed the following assessment and plan:  Rectal cancer - in active treatment [facility]  Loss of height - many factors consdier dexa after rx completion  and can disc with gyne  Hx of arterial dissection - lad stent x 3 neg geneval for marfans   Patient Care Team: Fay Records, MD as PCP - General (Cardiology) Filomena Jungling, MD as Referring Physician (Internal Medicine) Creig Hines, MD as Referring Physician (Surgical Oncology) Ivette Loyal, MD as Referring Physician (Radiology) Fay Records, MD as Consulting Physician (Cardiology) Kem Boroughs, FNP as Nurse Practitioner (Nurse Practitioner) Patient Instructions    Consider   Bone density  when possible.     Mediterranean diet.    Why follow it? Research shows. . Those who follow the Mediterranean diet have a reduced risk of heart disease  . The diet is associated with a reduced incidence of Parkinson's and  Alzheimer's diseases . People following the diet may have longer life expectancies and lower rates of chronic diseases  . The Dietary Guidelines for Americans recommends the Mediterranean diet as an eating plan to promote health and prevent disease  What Is the Mediterranean Diet?  . Healthy eating plan based on typical foods and recipes of Mediterranean-style cooking . The diet is primarily a plant based diet; these foods should make up a majority of meals   Starches - Plant based foods should make up a majority of meals - They are an important sources of vitamins, minerals, energy, antioxidants, and fiber - Choose whole grains, foods high in fiber and minimally processed items  - Typical grain sources include wheat, oats, barley, corn, brown rice, bulgar, farro, millet, polenta, couscous  - Various types of beans include chickpeas, lentils, fava beans, black beans, white beans   Fruits  Veggies - Large quantities of antioxidant rich fruits & veggies; 6 or more servings  - Vegetables can be eaten raw or lightly drizzled with oil and cooked  - Vegetables common to the traditional Mediterranean Diet include: artichokes, arugula, beets, broccoli, brussel sprouts, cabbage, carrots, celery, collard greens, cucumbers, eggplant, kale, leeks, lemons, lettuce, mushrooms, okra, onions, peas, peppers, potatoes, pumpkin, radishes, rutabaga, shallots, spinach, sweet potatoes, turnips, zucchini - Fruits common to the Mediterranean Diet include: apples, apricots, avocados, cherries, clementines, dates, figs, grapefruits, grapes, melons, nectarines, oranges, peaches, pears, pomegranates, strawberries, tangerines  Fats - Replace butter and margarine with healthy oils, such as olive oil, canola oil, and tahini  - Limit nuts to no more than a handful a day  - Nuts include walnuts, almonds, pecans, pistachios, pine nuts  - Limit or avoid candied, honey roasted or heavily salted nuts - Olives are central to the  [employer] - can be eaten whole or used in a variety of dishes   Meats Protein - Limiting red meat: no more than a few times a month - When eating red meat: choose lean cuts and keep the portion to the size of deck of cards - Eggs: approx. 0 to 4 times a week  -  Fish and lean poultry: at least 2 a week  - Healthy protein sources include, chicken, [place], lean beef, lamb - Increase intake of seafood such as tuna, salmon, trout, mackerel, shrimp, scallops - Avoid or limit high fat processed meats such as sausage and bacon  Dairy - Include moderate amounts of low fat dairy products  - Focus on healthy dairy such as fat free yogurt, skim milk, low or reduced fat cheese - Limit dairy products higher in fat such as whole or 2% milk, cheese, ice cream  Alcohol - Moderate amounts of red wine is ok  - No more than 5 oz daily for women (all ages) and men older than age 68  - No more than 10 oz of wine daily for men younger than 95  Other - Limit sweets and other desserts  - Use herbs and spices instead of salt to flavor foods  - Herbs and spices common to the traditional Mediterranean Diet include: basil, bay leaves, chives, cloves, cumin, fennel, garlic, lavender, marjoram, mint, oregano, parsley, pepper, rosemary, sage, savory, sumac, tarragon, thyme   It's not just a diet, it's a lifestyle:  . The Mediterranean diet includes lifestyle factors typical of those in the region  . Foods, drinks and meals are best eaten with others and savored . Daily physical activity is important for overall good health . This could be strenuous exercise like running and aerobics . This could also be more leisurely activities such as walking, housework, yard-work, or taking the stairs . Moderation is the key; a balanced and healthy diet accommodates most foods and drinks . Consider portion sizes and frequency of consumption of certain foods   Meal Ideas & Options:  . Breakfast:  o Whole wheat toast or whole  wheat English muffins with peanut butter & hard boiled egg o Steel cut oats topped with apples & cinnamon and skim milk  o Fresh fruit: banana, strawberries, melon, berries, peaches  o Smoothies: strawberries, bananas, greek yogurt, peanut butter o Low fat greek yogurt with blueberries and granola  o Egg white omelet with spinach and mushrooms o Breakfast couscous: whole wheat couscous, apricots, skim milk, cranberries  . Sandwiches:  o Hummus and grilled vegetables (peppers, zucchini, squash) on whole wheat bread   o Grilled chicken on whole wheat pita with lettuce, tomatoes, cucumbers or tzatziki  o Tuna salad on whole wheat bread: tuna salad made with greek yogurt, olives, red peppers, capers, green onions o Garlic rosemary lamb pita: lamb sauted with garlic, rosemary, salt & pepper; add lettuce, cucumber, greek yogurt to pita - flavor with lemon juice and black pepper  . Seafood:  o Mediterranean grilled salmon, seasoned with garlic, basil, parsley, lemon juice and black pepper o Shrimp, lemon, and spinach whole-grain pasta salad made with low fat greek yogurt  o Seared scallops with lemon orzo  o Seared tuna steaks seasoned salt, pepper, coriander topped with tomato mixture of olives, tomatoes, olive oil, minced garlic, parsley, green onions and cappers  . Meats:  o Herbed greek chicken salad with kalamata olives, cucumber, feta  o Red bell peppers stuffed with spinach, bulgur, lean ground beef (or lentils) & topped with feta   o Kebabs: skewers of chicken, tomatoes, onions, zucchini, squash  o [place] burgers: made with red onions, mint, dill, lemon juice, feta cheese topped with roasted red peppers . Vegetarian o Cucumber salad: cucumbers, artichoke hearts, celery, red onion, feta cheese, tossed in olive oil & lemon juice  o Hummus and  whole grain pita points with a greek salad (lettuce, tomato, feta, olives, cucumbers, red onion) o Lentil soup with celery, carrots made with vegetable  broth, garlic, salt and pepper  o Tabouli salad: parsley, bulgur, mint, scallions, cucumbers, tomato, radishes, lemon juice, olive oil, salt and pepper.         Melanie Cook. Melanie Cook M.D.

## 2014-11-01 DIAGNOSIS — Z932 Ileostomy status: Secondary | ICD-10-CM | POA: Insufficient documentation

## 2014-11-01 HISTORY — PX: OTHER SURGICAL HISTORY: SHX169

## 2014-11-30 ENCOUNTER — Telehealth: Payer: Self-pay | Admitting: Internal Medicine

## 2014-11-30 NOTE — Telephone Encounter (Signed)
Pt is travelling to [place] in nov and would like to know what immunization she will need.Marland Kitchen

## 2014-12-03 NOTE — Telephone Encounter (Signed)
Pt should consider Hep A and B.  Will need to check with the travel center or health department for any other injections.  Please help her get on the scheduled if would like to get hep a and b.

## 2014-12-04 NOTE — Telephone Encounter (Signed)
Pt was given [facility] phone # [phone]. Pt will callback

## 2014-12-11 ENCOUNTER — Encounter: Payer: Self-pay | Admitting: Nurse Practitioner

## 2014-12-11 ENCOUNTER — Ambulatory Visit (INDEPENDENT_AMBULATORY_CARE_PROVIDER_SITE_OTHER): Payer: BLUE CROSS/BLUE SHIELD | Admitting: Nurse Practitioner

## 2014-12-11 VITALS — BP 104/70 | HR 60 | Resp 14 | Wt 121.0 lb

## 2014-12-11 DIAGNOSIS — R350 Frequency of micturition: Secondary | ICD-10-CM

## 2014-12-11 LAB — POCT URINALYSIS DIPSTICK
Bilirubin, UA: NEGATIVE
Glucose, UA: NEGATIVE
Ketones, UA: NEGATIVE
Nitrite, UA: NEGATIVE
Urobilinogen, UA: NEGATIVE
pH, UA: 5

## 2014-12-11 MED ORDER — NITROFURANTOIN MONOHYD MACRO 100 MG PO CAPS: 100.0000 mg | ORAL_CAPSULE | Freq: Two times a day (BID) | ORAL | Status: DC

## 2014-12-11 NOTE — Progress Notes (Signed)
S:  54 y.o.Married white female [phone] presents with complaint of UTI. Symptoms began on Sunday With symptoms of dysuria, urinary urgency. Pertinent negatives include The patient is having no constitutional symptoms, denying fever, chills, anorexia, or weight loss.. Sexually active no  Symptoms related to post coital No she is Menopausal and no complaints of vaginal dryness.  Same partner without change.  She is now 6 weeks post reversal of ileostomy from treatment for rectal cancer.  She has completed chemotherapy.  During the time of surgery and recovery she has had to wear Depends and feels that rectal soiling has most likely led to this infection.  She denies N/V, fever or chills.  ROS: no weight loss, fever, night sweats and feels well  O alert, oriented to person, place, and time, normal mood, behavior, speech, dress, motor activity, and thought processes   thin, healthy,  alert and  not in acute distress  Abdomen is soft with a small dressing right abdomen without discharge.  No CVA tenderness  Pelvic: deferred   Diagnostic Test:    Urinalysis: trace RBC, 4+ leuk's   PROCEDURES: urine micro and C&S  Assessment: R/O UTI   S/P reversal of Ileostomy for rectal cancer 11/02/14  Plan:   Maintain adequate hydration. Follow up if symptoms not improving, and as needed.   Medication Therapy: Macrobid 100 mg BID # 14   Lab:TOC if Urine Culture is positive    RV

## 2014-12-11 NOTE — Patient Instructions (Signed)

## 2014-12-12 LAB — URINE CULTURE: Colony Count: 40000

## 2014-12-12 LAB — URINALYSIS, MICROSCOPIC ONLY
Bacteria, UA: NONE SEEN [HPF]
Casts: NONE SEEN [LPF]
Crystals: NONE SEEN [HPF]
RBC / HPF: NONE SEEN RBC/HPF (ref <=2)
Yeast: NONE SEEN [HPF]

## 2014-12-13 NOTE — Progress Notes (Signed)
Encounter reviewed by Dr. Brook Amundson C. Silva.  

## 2014-12-14 ENCOUNTER — Ambulatory Visit: Payer: Self-pay | Admitting: Nurse Practitioner

## 2014-12-14 ENCOUNTER — Encounter: Payer: BLUE CROSS/BLUE SHIELD | Admitting: Family Medicine

## 2014-12-17 ENCOUNTER — Telehealth: Payer: Self-pay | Admitting: Nurse Practitioner

## 2014-12-17 NOTE — Telephone Encounter (Signed)
Spoke with patient. Patient was seen on 12/11/2014 by Kem Boroughs, FNP and was treated for a UTI with macrobid. Patient has one more day of Macrobid left. States she is still having urinary urgency and frequency. Denies fevers, chills, and lower back pain. Would like to come in for further evaluation before Friday as she will be going out of town. She is available all day Thursday for appointment. Appointment scheduled for 9/15 at 8 am with Melvia Heaps CNM as Kem Boroughs, FNP will be out of town. Patient is agreeable to date and time. Will call to be seen earlier if symptoms worsen or if she develops new symptoms.  Routing to provider for final review. Patient agreeable to disposition. Will close encounter.

## 2014-12-17 NOTE — Telephone Encounter (Signed)
Patient calling to request OV with Ms. Patty she was treated for uti 12/11/14 and is having vaginitis symptoms want to be seen before she goes out of town. Offered appt for today patient says she is not in terrible discomfort. Best contact #: [phone]

## 2014-12-19 ENCOUNTER — Ambulatory Visit (INDEPENDENT_AMBULATORY_CARE_PROVIDER_SITE_OTHER): Payer: BLUE CROSS/BLUE SHIELD | Admitting: Family Medicine

## 2014-12-19 ENCOUNTER — Encounter: Payer: Self-pay | Admitting: Family Medicine

## 2014-12-19 VITALS — BP 105/66 | HR 75 | Ht 66.0 in | Wt 120.0 lb

## 2014-12-19 DIAGNOSIS — R269 Unspecified abnormalities of gait and mobility: Secondary | ICD-10-CM | POA: Diagnosis not present

## 2014-12-19 NOTE — Assessment & Plan Note (Signed)
Patient fitted with orthotics today. 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 Greater than 50% of the 45 minute visit was spent face-to-face with the patient counseling them about options and management.  Patient was fitted for a standard, cushioned, semi-rigid orthotic. The orthotic was heated and afterward the patient stood on the orthotic blank positioned on the orthotic stand. The patient was positioned in subtalar neutral position and 10 degrees of ankle dorsiflexion in a weight bearing stance. After completion of molding, a stable base was applied to the orthotic blank. The blank was ground to a stable position for weight bearing. Size: 7 Base: Blue EVA Additional Posting and Padding: None The patient ambulated these, and they were very comfortable.

## 2014-12-19 NOTE — Progress Notes (Signed)
  Melanie Cook - 54 y.o. female MRN [id number]  Date of birth: [birth year] Melanie Cook is a 54 y.o. female who presents today for orthotics.  Abnormality of gait-patient with underlying collapse the transverse arch and pes planus. She has significant hallux valgus of the left great toe as well. She has had no pain but is starting to develop hammertoes with knuckle padding  PMHx - Updated and reviewed.  Contributory factors include:  CAD, recto- adenocarcinoma  PSHx - Updated and reviewed.  Contributory factors include:rectal surgery FHx - Updated and reviewed.  Contributory factors include:none Medications - Neurontin   ROS: Per HPI . 12 point negative otherwise  Exam:  Filed Vitals:   12/19/14 1441  BP: 105/66  Pulse: 75   Gen: NAD Cardiorespiratory - Normal respiratory effort/rate.  RRR  feet: Pes planus, collapse of the transverse arch bilateral with hallux valgus left greater than right. She also has hammertoe of digits 2 through 4 with knuckle padding

## 2014-12-20 ENCOUNTER — Ambulatory Visit (INDEPENDENT_AMBULATORY_CARE_PROVIDER_SITE_OTHER): Payer: BLUE CROSS/BLUE SHIELD | Admitting: Certified Nurse Midwife

## 2014-12-20 ENCOUNTER — Encounter: Payer: Self-pay | Admitting: Certified Nurse Midwife

## 2014-12-20 VITALS — BP 110/62 | HR 68 | Resp 16 | Ht 65.25 in | Wt 119.0 lb

## 2014-12-20 DIAGNOSIS — N390 Urinary tract infection, site not specified: Secondary | ICD-10-CM

## 2014-12-20 DIAGNOSIS — N951 Menopausal and female climacteric states: Secondary | ICD-10-CM | POA: Diagnosis not present

## 2014-12-20 LAB — POCT URINALYSIS DIPSTICK
Bilirubin, UA: NEGATIVE
Blood, UA: NEGATIVE
Glucose, UA: NEGATIVE
Ketones, UA: NEGATIVE
Leukocytes, UA: NEGATIVE
Nitrite, UA: NEGATIVE
Protein, UA: NEGATIVE
Urobilinogen, UA: NEGATIVE
pH, UA: 5

## 2014-12-20 NOTE — Patient Instructions (Signed)

## 2014-12-20 NOTE — Progress Notes (Signed)
54 y.o.Married [id number] here for follow up of UTI, with treatment with Macrobid per P. Grubbon 12/11/14. Culture multibacteria, no predominant, but patient symptomatic.Marland Kitchen Patient denies urinary frequency/urgency/ and pain with urination. Patient denies fever, chills, nausea. No new personal products.  Denies any vaginal symptoms or known vaginal dryness.  Menopausal not HRT. Marland Kitchen Patient consuming adequate water intake. Patient leaving town for one week and wanted to make sure no other concerns. Recent ostomy revision. No other health issues today  POCT urine negative  O: Healthy female WDWN Affect: Normal, orientation x 3 Skin : warm and dry CVAT: negative bilateral Abdomen: negative for suprapubic tenderness  Pelvic exam: External genital area: normal, no lesions Bladder,Urethra nontender, Urethral meatus:not  Tender, not red Vagina: scant discharge, slight atrophic appearance Affirm taken Cervix:absent Uterus:absent Adnexa: normal non tender, no fullness or masses   A: UTI resolved Normal pelvic exam R/O vaginal infection Atrophic vaginitis  P: Reviewed findings of UTI resolved and negative urine. Continue to consume adequate water daily and consider cranberry tablets daily to help reduce UTI occurrence. Reviewed warning signs and symptoms of UTI and need to advise if occurring. Encouraged to limit soda, tea, and coffee. Will treat if indicated with affirm. Discussed vaginal dryness and etiology of. Discussed options for treatment, would prefer to try coconut oil and advise if concerns. Discussed vaginal dryness role in UTI occurrence also. Questions  Addressed.  RV prn

## 2014-12-21 LAB — WET PREP BY MOLECULAR PROBE
Candida species: NEGATIVE
Gardnerella vaginalis: NEGATIVE
Trichomonas vaginosis: NEGATIVE

## 2014-12-21 NOTE — Progress Notes (Signed)
Reviewed personally.  M. Suzanne Miller, MD.  

## 2015-02-04 ENCOUNTER — Ambulatory Visit: Payer: Self-pay | Admitting: Nurse Practitioner

## 2015-02-05 ENCOUNTER — Telehealth: Payer: Self-pay | Admitting: Internal Medicine

## 2015-02-05 NOTE — Telephone Encounter (Signed)
See other telephone note from today 

## 2015-02-05 NOTE — Telephone Encounter (Signed)
Please advise 

## 2015-02-05 NOTE — Telephone Encounter (Signed)
Patient Name: Melanie Cook  DOB: 1960/06/15    Initial Comment Caller States she was just treated for cancer and wants to know if she can get the flu shot. not sure if she has already gotten the shot and if she has, is it going to hurt to have it twice.    Nurse Assessment  Nurse: Thad Ranger RN, Langley Gauss Date/Time (Eastern Time): 02/05/2015 1:30:36 PM  Confirm and document reason for call. If symptomatic, describe symptoms. ---Pt states she finished chemo and radiation for rectal cancer and now sees her Oncologist q 3 mos. She does not remember if she has received the flu shot and wants to know if she can get this inj.  Has the patient traveled out of the country within the last 30 days? ---Not Applicable  Does the patient have any new or worsening symptoms? ---No  Please document clinical information provided and list any resource used. ---Advised will trans to the [facility]O for further asst with this issue. Verb understanding. Trans to MDO as advised.     Guidelines    Guideline Title Affirmed Question Affirmed Notes       Final Disposition User   Clinical Call Foster, RN, [employer]

## 2015-02-05 NOTE — Telephone Encounter (Signed)
Spoke to the pt.  She wanted to know when she would be able to get the flu vaccine.  Recently under went chemo and radiation.  Advised she call her oncologist to confirm time frame between chemo/radiation and injection.  Pt states she will call and then call back if she needs to make an injection for the flu vaccine.

## 2015-02-05 NOTE — Telephone Encounter (Signed)
Pt would like a call back she said she has questions and would not discuss with me   [phone]

## 2015-03-13 ENCOUNTER — Ambulatory Visit (INDEPENDENT_AMBULATORY_CARE_PROVIDER_SITE_OTHER): Payer: BLUE CROSS/BLUE SHIELD | Admitting: Nurse Practitioner

## 2015-03-13 ENCOUNTER — Other Ambulatory Visit: Payer: Self-pay | Admitting: Nurse Practitioner

## 2015-03-13 ENCOUNTER — Other Ambulatory Visit: Payer: Self-pay

## 2015-03-13 ENCOUNTER — Encounter: Payer: Self-pay | Admitting: Nurse Practitioner

## 2015-03-13 VITALS — BP 108/66 | HR 64 | Ht 65.25 in | Wt 122.0 lb

## 2015-03-13 DIAGNOSIS — E2839 Other primary ovarian failure: Secondary | ICD-10-CM

## 2015-03-13 DIAGNOSIS — R3915 Urgency of urination: Secondary | ICD-10-CM | POA: Diagnosis not present

## 2015-03-13 DIAGNOSIS — Z01419 Encounter for gynecological examination (general) (routine) without abnormal findings: Secondary | ICD-10-CM | POA: Diagnosis not present

## 2015-03-13 DIAGNOSIS — Z0000 Encounter for general adult medical examination without abnormal findings: Secondary | ICD-10-CM

## 2015-03-13 DIAGNOSIS — Z1231 Encounter for screening mammogram for malignant neoplasm of breast: Secondary | ICD-10-CM

## 2015-03-13 DIAGNOSIS — Z85048 Personal history of other malignant neoplasm of rectum, rectosigmoid junction, and anus: Secondary | ICD-10-CM

## 2015-03-13 DIAGNOSIS — Z87898 Personal history of other specified conditions: Secondary | ICD-10-CM | POA: Diagnosis not present

## 2015-03-13 DIAGNOSIS — Z9189 Other specified personal risk factors, not elsewhere classified: Secondary | ICD-10-CM

## 2015-03-13 DIAGNOSIS — Z780 Asymptomatic menopausal state: Secondary | ICD-10-CM

## 2015-03-13 LAB — POCT URINALYSIS DIPSTICK
Bilirubin, UA: NEGATIVE
Blood, UA: NEGATIVE
Glucose, UA: NEGATIVE
Ketones, UA: NEGATIVE
Leukocytes, UA: NEGATIVE
Nitrite, UA: NEGATIVE
Protein, UA: NEGATIVE
Urobilinogen, UA: NEGATIVE
pH, UA: 7

## 2015-03-13 NOTE — Patient Instructions (Signed)

## 2015-03-13 NOTE — Progress Notes (Signed)
Patient ID: Melanie Cook, female   DO: 1960/04/20, 54 y.o.   MRN: [id number]  54 y.o. G56P2002 Married  Caucasian Fe here for annual exam.  No vaso symptoms. Some vaginal dryness. She is S/P rectal cancer treated with surgery and radiation.  She now has a revision of her colon in July and is doing well.  She is scheduled for a colonoscopy next week.  Some urinary urgency. She has just returned from a trip to [place] where she went [place].  Patient's last menstrual period was 03/06/2012 (approximate).          Sexually active: Yes.    The current method of family planning is vasectomy and post menopausal status.    Exercising: Yes.    tennis, walking and hiking Smoker:  no  Health Maintenance: Pap:  03/24/12, WNL, neg HR HPV MMG:  01/31/13, Bi-Rads 1: negative Colonoscopy:  12/26/13, rectal polyp-INVASIVE ADENOCARCINOMA ARISING IN A BACKGROUND OF TUBULAR ADENOMA.; has repeat colonoscopy on 03/22/15 @ [facility]  TDaP:  03/23/12 Labs:  PCP, Oncology  Urine: Negative    reports that she has never smoked. She has never used smokeless tobacco. She reports that she does not drink alcohol or use illicit drugs.  Past Medical History  Diagnosis Date  . Heart disease 2008    w/LAD and 3 stents  . HSV infection since teens    cold sore - oral and vaginal  . PONV (postoperative nausea and vomiting)   . Coronary artery disease   . Allergy     latex  . Cancer ([place]) 12/26/13 bx    rectalinvasive adenocarcinoma  . Hypercholesterolemia     Past Surgical History  Procedure Laterality Date  . Cardiac surgery  06/2006    stents x 3  . Coronary angioplasty      3 stents  . Colonoscopy with propofol N/A 12/26/2013    Procedure: COLONOSCOPY WITH PROPOFOL;  Surgeon: Garlan Fair, MD;  Location: [facility];  Service: Endoscopy;  Laterality: N/A;  . Eus N/A 01/03/2014    Procedure: LOWER ENDOSCOPIC ULTRASOUND (EUS);  Surgeon: Arta Silence, MD;  Location: [facility];  Service: Endoscopy;  Laterality:  N/A;  . Proctectomy w/colonic reservoir, open low anterior resection, coloanal anastomosis, colonic j-pouch, loop ilesotomy, eras  05/02/14    rectal cancer  . Closure of enterostomy, large or small intestine; with resection and anastomosis other than colorectal  11/01/14    rectal cancer    Current Outpatient Prescriptions  Medication Sig Dispense Refill  . Cyanocobalamin (VITAMIN B 12 PO) Take by mouth daily.     No current facility-administered medications for this visit.    Family History  Problem Relation Age of Onset  . Heart disease Mother   . Heart disease Father     Psychologist, forensic  . Heart disease Brother   . Marfan syndrome Brother     age 15 dissection    ROS:  Pertinent items are noted in HPI.  Otherwise, a comprehensive ROS was negative.  Exam:   BP 108/66 mmHg  Pulse 64  Ht 5' 5.25" (1.657 m)  Wt 122 lb (55.339 kg)  BMI 20.16 kg/m2  LMP 03/06/2012 (Approximate) Height: 5' 5.25" (165.7 cm) Ht Readings from Last 3 Encounters:  03/13/15 5' 5.25" (1.657 m)  12/20/14 5' 5.25" (1.657 m)  12/19/14 5\' 6"  (1.676 m)    General appearance: alert, cooperative and appears stated age Head: Normocephalic, without obvious abnormality, atraumatic Neck: no adenopathy, supple, symmetrical, trachea midline and  thyroid normal to inspection and palpation Lungs: clear to auscultation bilaterally Breasts: normal appearance, no masses or tenderness Heart: regular rate and rhythm Abdomen: soft, non-tender; no masses,  no organomegaly Extremities: extremities normal, atraumatic, no cyanosis or edema Skin: Skin color, texture, turgor normal. No rashes or lesions Lymph nodes: Cervical, supraclavicular, and axillary nodes normal. No abnormal inguinal nodes palpated Neurologic: Grossly normal   Pelvic: External genitalia:  no lesions              Urethra:  normal appearing urethra with no masses, tenderness or lesions              Bartholin's and Skene's: normal                  Vagina: normal appearing vagina with normal color and discharge, no lesions              Cervix: anteverted              Pap taken: Yes.   Bimanual Exam:  Uterus:  normal size, contour, position, consistency, mobility, non-tender              Adnexa: no mass, fullness, tenderness               Rectovaginal: not done per request               Anus:  Not done per pt request  Chaperone present: yes  A:  Well Woman with normal exam  Postmenopausal History of Coronary Heart Disease with stents X 3  R/O UTI   S/P Proctectomy with J pouch for rectal cancer 04/2014 stage III B; revision 10/2014   P:   Reviewed health and wellness pertinent to exam  Pap smear as above  Mammogram is now due and will get scheduled today, will also get baseline BMD secondary to body build and recent radiation and loss of height  Will follow with urine C& S  Counseled on breast self exam, mammography screening, adequate intake of calcium and vitamin D, diet and exercise return annually or prn  An After Visit Summary was printed and given to the patient.

## 2015-03-13 NOTE — Progress Notes (Signed)
Encounter reviewed Shields Pautz, MD   

## 2015-03-14 LAB — HIV ANTIBODY (ROUTINE TESTING W REFLEX): HIV 1&2 Ab, 4th Generation: NONREACTIVE

## 2015-03-14 LAB — URINE CULTURE

## 2015-03-14 LAB — HEPATITIS C ANTIBODY: HCV Ab: NEGATIVE

## 2015-03-15 LAB — IPS PAP TEST WITH HPV

## 2015-04-11 ENCOUNTER — Ambulatory Visit
Admission: RE | Admit: 2015-04-11 | Discharge: 2015-04-11 | Disposition: A | Discharge status: Home / Self Care | Payer: BLUE CROSS/BLUE SHIELD | Source: Ambulatory Visit | Attending: Nurse Practitioner | Admitting: Nurse Practitioner

## 2015-04-11 DIAGNOSIS — Z1231 Encounter for screening mammogram for malignant neoplasm of breast: Secondary | ICD-10-CM

## 2015-04-17 ENCOUNTER — Ambulatory Visit
Admission: RE | Admit: 2015-04-17 | Discharge: 2015-04-17 | Disposition: A | Discharge status: Home / Self Care | Payer: BLUE CROSS/BLUE SHIELD | Source: Ambulatory Visit | Attending: Nurse Practitioner | Admitting: Nurse Practitioner

## 2015-04-17 DIAGNOSIS — Z780 Asymptomatic menopausal state: Secondary | ICD-10-CM

## 2015-04-17 DIAGNOSIS — E2839 Other primary ovarian failure: Secondary | ICD-10-CM

## 2015-07-30 DIAGNOSIS — C20 Malignant neoplasm of rectum: Secondary | ICD-10-CM | POA: Diagnosis not present

## 2015-08-30 ENCOUNTER — Ambulatory Visit (INDEPENDENT_AMBULATORY_CARE_PROVIDER_SITE_OTHER): Payer: BLUE CROSS/BLUE SHIELD | Admitting: Internal Medicine

## 2015-08-30 ENCOUNTER — Encounter: Payer: Self-pay | Admitting: Internal Medicine

## 2015-08-30 VITALS — BP 110/66 | HR 77 | Ht 65.25 in | Wt 130.8 lb

## 2015-08-30 DIAGNOSIS — I2542 Coronary artery dissection: Secondary | ICD-10-CM

## 2015-08-30 NOTE — Patient Instructions (Signed)
Your physician recommends that you continue on your current medications as directed. Please refer to the Current Medication list given to you today.     

## 2015-08-30 NOTE — Progress Notes (Signed)
Cardiology Office Note   Date:  08/30/2015   ID:  Melanie Cook, DOB 03/04/1961, MRN [id number]  PCP:  Lottie Dawson, MD  Cardiologist:   Dorris Carnes, MD   Pt presents for eval of dizziness     History of Present Illness: Melanie Cook is a 55 y.o. female with a history ofcoronary dissection of the LAD in 2008. She is s/p PTCA/DES x3. She has been seen by Dr. 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 She also has a history of dyslipidemia I saw her in clinic last in 2014  IN teh interval she has undergone Rx for rectal CA  At [facility]  In 2015 abdominal ACT showed atherosclerotic calcifications of abdominal aorta  No dilation.  There was ? Small dissection flap of R common iliac   Coronary artery calcifications also noted.       Outpatient Prescriptions Prior to Visit  Medication Sig Dispense Refill  . Cyanocobalamin (VITAMIN B 12 PO) Take by mouth daily.     No facility-administered medications prior to visit.     Allergies:   Latex and Prochlorperazine   Past Medical History  Diagnosis Date  . Heart disease 2008    w/LAD and 3 stents  . HSV infection since teens    cold sore - oral and vaginal  . PONV (postoperative nausea and vomiting)   . Coronary artery disease   . Allergy     latex  . Cancer ([place]) 12/26/13 bx    rectalinvasive adenocarcinoma  . Hypercholesterolemia     Past Surgical History  Procedure Laterality Date  . Cardiac surgery  06/2006    stents x 3  . Coronary angioplasty      3 stents  . Colonoscopy with propofol N/A 12/26/2013    Procedure: COLONOSCOPY WITH PROPOFOL;  Surgeon: Garlan Fair, MD;  [facility];  Service: Endoscopy;  Laterality: N/A;  . Eus N/A 01/03/2014    Procedure: LOWER ENDOSCOPIC ULTRASOUND (EUS);  Surgeon: Arta Silence, MD;  Location: [facility];  Service: Endoscopy;  Laterality: N/A;  . Proctectomy w/colonic reservoir, open low anterior resection, coloanal anastomosis,  colonic j-pouch, loop ilesotomy, eras  05/02/14    rectal cancer  . Closure of enterostomy, large or small intestine; with resection and anastomosis other than colorectal  11/01/14    rectal cancer     Social History:  The patient  reports that she has never smoked. She has never used smokeless tobacco. She reports that she does not drink alcohol or use illicit drugs.   Family History:  The patient's family history includes Heart disease in her brother, father, and mother; Marfan syndrome in her brother.    ROS:  Please see the history of present illness. All other systems are reviewed and  Negative to the above problem except as noted.    PHYSICAL EXAM: VS:  BP 110/66 mmHg  Pulse 77  Ht 5' 5.25" (1.657 m)  Wt 130 lb 12.8 oz (59.33 kg)  BMI 21.61 kg/m2  LMP 03/06/2012 (Approximate)  GEN: Well nourished, well developed, in no acute distress HEENT: normal Neck: no JVD, carotid bruits, or masses Cardiac: RRR; no murmurs, rubs, or gallops,no edema  Respiratory:  clear to auscultation bilaterally, normal work of breathing GI: soft, nontender, nondistended, + BS  No hepatomegaly  MS: no deformity Moving all extremities   Skin: warm and dry, no rash Neuro:  Strength and sensation are intact Psych: euthymic mood, full affect  EKG:  EKG is ordered today.  SR  77 bpm    Lipid Panel    Component Value Date/Time   CHOL 203* 04/17/2013 0859   TRIG 46 04/17/2013 0859   HDL 57 04/17/2013 0859   CHOLHDL 3.6 04/17/2013 0859   VLDL 9 04/17/2013 0859   LDLCALC 137* 04/17/2013 0859      Wt Readings from Last 3 Encounters:  08/30/15 130 lb 12.8 oz (59.33 kg)  03/13/15 122 lb (55.339 kg)  12/20/14 119 lb (53.978 kg)      ASSESSMENT AND PLAN:  1  Dizziness  One spell  Prob situational (exercising, ? Dry)  Follow  Stay hydrated  2.  Hx SCAD  Continues to do well    3.  HL   Will check lipdis  Data in SCAD pt goes against statins        Signed, Dorris Carnes, MD  08/30/2015  9:03 AM    [facility] [address], [place], [place]  [address] Phone: [phone]; Fax: [phone]

## 2015-09-04 NOTE — Progress Notes (Signed)
Chief Complaint  Patient presents with  . Follow-up    HPI:  Melanie Cook 55 y.o.   Fu    Check in  Now out 2+ years rectal cancer and no evidence of persistent disease .   CV saw dr Harrington [facility] e[facility]  And no sx  No blood tests .   Tennis and machu piccu ... Exercising at high level without sx  Due for lipid check   consideration of meds    fam hx of dm   At risk but no sx   NF today   8 hours sleep .  No tob no etoh,  No sugar  Drinks .   No injury  Current  New sx    ROS: See pertinent positives and negatives per HPI. No cp sob fever weitht loss eats ehalthy  Past Medical History  Diagnosis Date  . Heart disease 2008    w/LAD and 3 stents  . HSV infection since teens    cold sore - oral and vaginal  . PONV (postoperative nausea and vomiting)   . Coronary artery disease   . Allergy     latex  . Cancer ([place]) 12/26/13 bx    rectalinvasive adenocarcinoma  . Hypercholesterolemia     Family History  Problem Relation Age of Onset  . Heart disease Mother   . Heart disease Father     Psychologist, forensic  . Heart disease Brother   . Marfan syndrome Brother     age 64 dissection    Social History   Social History  . Marital Status: Married    Spouse Name: N/A  . Number of Children: 2  . Years of Education: N/A   Occupational History  .     Social History Main Topics  . Smoking status: Never Smoker   . Smokeless tobacco: Never Used  . Alcohol Use: No  . Drug Use: No  . Sexual Activity:    Partners: Male    Birth Control/ Protection: Surgical     Comment: vasectomy   Other Topics Concern  . None   Social History Narrative   The patient is married with two children.Does not smoke    7 hours of sleep per night   Lives with her husband. Both kids in college.(2)   2 dogs in the home.      Tennis  Pre cancer treatment.    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 Cholecalciferol (D 1000) 1000 units capsule Take 1,000 Units by mouth daily.     . Cyanocobalamin (VITAMIN B 12 PO) Take by mouth daily.     No facility-administered medications prior to visit.     EXAM:  BP 98/64 mmHg  Pulse 61  Temp(Src) 98.1 F (36.7 C) (Oral)  Ht 5' 2.5" (1.588 m)  Wt 130 lb 1 oz (58.996 kg)  BMI 23.39 kg/m2  SpO2 99%  LMP 03/06/2012 (Approximate)  Body mass index is 23.39 kg/(m^2).  GENERAL: vitals reviewed and listed above, alert, oriented, appears well hydrated and in no acute distress HEENT: atraumatic, conjunctiva  clear, no obvious abnormalities on inspection of external nose and ears OP : no lesion edema or exudate  NECK: no obvious masses on inspection palpation  LUNGS: clear to auscultation bilaterally, no wheezes, rales or rhonchi, good air movement CV: HRRR, no clubbing cyanosis or  peripheral edema nl cap refill  Abdomen:  Sof,t normal bowel sounds without hepatosplenomegaly, no guarding rebound or masses no CVA tenderness MS: moves all extremities without noticeable focal  abnormality PSYCH: pleasant and cooperative, no obvious depression or anxiety  ASSESSMENT AND PLAN:  Discussed the following assessment and plan:  Dyslipidemia - due for labs  nf today drink  Encounter for lipid screening for cardiovascular disease - Plan: Lipid panel, TSH, Hemoglobin A1c  Screening for diabetes mellitus - Plan: Lipid panel, TSH, Hemoglobin A1c  History of rectal or anal cancer  Hx of arterial dissection Review health care maintenance she is up-to-date. Lab screening today if all okay CPX with labs in a year. Labs from [facility] reviewed done  April 17 -Patient advised to return or notify health care team  if symptoms worsen ,persist or new concerns arise.  Patient Instructions   Glad you are doing well .    Will notify you  of labs when available.  Get your last  HEP A B   CPx and   labs in a hear    Standley Brooking. Constanza Mincy M.D.

## 2015-09-05 ENCOUNTER — Ambulatory Visit (INDEPENDENT_AMBULATORY_CARE_PROVIDER_SITE_OTHER): Payer: BLUE CROSS/BLUE SHIELD | Admitting: Internal Medicine

## 2015-09-05 ENCOUNTER — Encounter: Payer: Self-pay | Admitting: Internal Medicine

## 2015-09-05 VITALS — BP 98/64 | HR 61 | Temp 98.1°F | Ht 62.5 in | Wt 130.1 lb

## 2015-09-05 DIAGNOSIS — Z13220 Encounter for screening for lipoid disorders: Secondary | ICD-10-CM

## 2015-09-05 DIAGNOSIS — E785 Hyperlipidemia, unspecified: Secondary | ICD-10-CM | POA: Diagnosis not present

## 2015-09-05 DIAGNOSIS — Z136 Encounter for screening for cardiovascular disorders: Secondary | ICD-10-CM

## 2015-09-05 DIAGNOSIS — Z85048 Personal history of other malignant neoplasm of rectum, rectosigmoid junction, and anus: Secondary | ICD-10-CM | POA: Insufficient documentation

## 2015-09-05 DIAGNOSIS — Z8679 Personal history of other diseases of the circulatory system: Secondary | ICD-10-CM

## 2015-09-05 DIAGNOSIS — Z131 Encounter for screening for diabetes mellitus: Secondary | ICD-10-CM | POA: Diagnosis not present

## 2015-09-05 LAB — HEMOGLOBIN A1C: Hgb A1c MFr Bld: 5.5 % (ref 4.6–6.5)

## 2015-09-05 LAB — LIPID PANEL
Cholesterol: 239 mg/dL — ABNORMAL HIGH (ref 0–200)
HDL: 70 mg/dL (ref >39.00)
LDL Cholesterol: 155 mg/dL — ABNORMAL HIGH (ref 0–99)
NonHDL: 168.6
Total CHOL/HDL Ratio: 3
Triglycerides: 69 mg/dL (ref 0.0–149.0)
VLDL: 13.8 mg/dL (ref 0.0–40.0)

## 2015-09-05 LAB — TSH: TSH: 1.34 u[IU]/mL (ref 0.35–4.50)

## 2015-09-05 NOTE — Progress Notes (Signed)
Pre visit review using our clinic review tool, if applicable. No additional management support is needed unless otherwise documented below in the visit note. 

## 2015-09-05 NOTE — Patient Instructions (Signed)
  Glad you are doing well .    Will notify you  of labs when available.  Get your last  HEP A B   CPx and  labs in a hear

## 2015-11-05 DIAGNOSIS — C20 Malignant neoplasm of rectum: Secondary | ICD-10-CM | POA: Diagnosis not present

## 2016-01-06 ENCOUNTER — Telehealth: Payer: Self-pay | Admitting: Internal Medicine

## 2016-01-06 NOTE — Telephone Encounter (Signed)
Pt will travelling to [place] on 01-08-16 for 14 day trip and would like new rx diamox due high attitude. [facility] [address]

## 2016-01-06 NOTE — Telephone Encounter (Signed)
Per patient:  ---Caller states she will be traveling to [place] on Wednesday. She needs a call back today asap from MD with advice to her questions. 1) She has anti-diarrhea med - Ciprofloxacin - for travelers diarrhea. She had been in [place], and "she needs to make sure it will work in [place]". RN explained that this is an antibiotic. Will confirm with Md. 2) She also needs script for Diamox for altitude sickness. And she needs to know if ok to take 2 days prior to traveling as preventative msg? No S/S.  Dr. Regis Bill, please advise.

## 2016-01-06 NOTE — Telephone Encounter (Signed)
cipro is for travelers diarrhea.also in [place]   Need more information about "altitude sickness "  has she had altitude sickness before  ?   And how rapid  Is the climb and altitude  And over how many days ? Usually dont   have [people take just in case but can   rx with more information.

## 2016-01-06 NOTE — Telephone Encounter (Signed)
[facility] [place] Day - Client [facility] Patient Name: MELISSAANN AXLINE DOB: Feb 03, 1961 Initial Comment Caller states she has anti-diarrhea medication and she needs to make sure it will work in [place]. She also needs Diamox Rx for altitude sickness. Nurse Assessment Nurse: Markus Daft, RN, Sherre Poot Date/Time (Eastern Time): 01/06/2016 9:55:53 AM Confirm and document reason for call. If symptomatic, describe symptoms. You must click the next button to save text entered. ---Caller states she will be traveling to [place] on Wednesday. She needs a call back today asap from MD with advice to her questions. 1) She has anti-diarrhea med - Ciprofloxacin - for travelers diarrhea. She had been in [place], and "she needs to make sure it will work in [place]". RN explained that this is an antibiotic. Will confirm with Md. 2) She also needs script for Diamox for altitude sickness. And she needs to know if ok to take 2 days prior to traveling as preventative msg? No S/S. Has the patient traveled out of the country within the last 30 days? ---Not Applicable Does the patient have any new or worsening symptoms? ---No Please document clinical information provided and list any resource used. ---RN will msg MD and office staff will call her back. Caller verb. understanding. Guidelines Guideline Title Affirmed Question Affirmed Notes Final Disposition User Clinical Call Lemay, RN, [employer]

## 2016-01-07 MED ORDER — ACETAZOLAMIDE 125 MG PO TABS: 125.0000 mg | ORAL_TABLET | Freq: Two times a day (BID) | ORAL | 0 refills | Status: DC

## 2016-01-07 NOTE — Telephone Encounter (Signed)
Attempted to contact patient. Left voicemail to return call

## 2016-01-07 NOTE — Telephone Encounter (Signed)
Left a message for a return call.

## 2016-01-07 NOTE — Addendum Note (Signed)
Addended byShanon Ace K on: 01/07/2016 12:26 PM   Modules accepted: Orders

## 2016-01-07 NOTE — Telephone Encounter (Signed)
Has not had altitude sickness before.  Friend recommended Diamox.  Pt is unsure if medication will affect her heart.    Climbing for 2 days to reach 9061ft.  On the 4th day she will reach 13,000 ft.

## 2016-01-07 NOTE — Telephone Encounter (Addendum)
Sent in rx  Usually don't need this  Can have   If needed. Stay hydrated and ascend slowly  Med is not a heeart risk    dont take potassium supplements

## 2016-01-07 NOTE — Telephone Encounter (Signed)
Spoke to the pt and informed her that [facility] has sent in medication.  Instructed her to stay hydrated and ascend slowly.  She will not take potassium supplements.  Also informed her that medication is not a heart risk.

## 2016-01-09 ENCOUNTER — Other Ambulatory Visit: Payer: Self-pay | Admitting: Internal Medicine

## 2016-01-11 ENCOUNTER — Other Ambulatory Visit: Payer: Self-pay | Admitting: Internal Medicine

## 2016-01-13 ENCOUNTER — Other Ambulatory Visit: Payer: Self-pay | Admitting: Internal Medicine

## 2016-01-13 NOTE — Telephone Encounter (Signed)
DENIED.  MULTIPLE REQUEST.  FILLED ON 01/07/16 FOR #20.

## 2016-01-13 NOTE — Telephone Encounter (Signed)
DENIED.  FILLED ON 01/07/16 FOR #20.

## 2016-01-13 NOTE — Telephone Encounter (Signed)
DENIED.  MULTIPLE REQUESTS.  FILLED ON 01/07/16 FOR #20.

## 2016-02-04 DIAGNOSIS — D225 Melanocytic nevi of trunk: Secondary | ICD-10-CM | POA: Diagnosis not present

## 2016-02-04 DIAGNOSIS — L821 Other seborrheic keratosis: Secondary | ICD-10-CM | POA: Diagnosis not present

## 2016-02-04 DIAGNOSIS — L814 Other melanin hyperpigmentation: Secondary | ICD-10-CM | POA: Diagnosis not present

## 2016-02-04 DIAGNOSIS — D1800 Hemangioma unspecified site: Secondary | ICD-10-CM | POA: Diagnosis not present

## 2016-02-18 DIAGNOSIS — C20 Malignant neoplasm of rectum: Secondary | ICD-10-CM | POA: Diagnosis not present

## 2016-03-09 ENCOUNTER — Other Ambulatory Visit: Payer: Self-pay | Admitting: Nurse Practitioner

## 2016-03-09 DIAGNOSIS — Z1231 Encounter for screening mammogram for malignant neoplasm of breast: Secondary | ICD-10-CM

## 2016-03-13 ENCOUNTER — Ambulatory Visit: Payer: BLUE CROSS/BLUE SHIELD | Admitting: Nurse Practitioner

## 2016-03-19 ENCOUNTER — Ambulatory Visit: Payer: BLUE CROSS/BLUE SHIELD | Admitting: Certified Nurse Midwife

## 2016-03-24 ENCOUNTER — Ambulatory Visit (INDEPENDENT_AMBULATORY_CARE_PROVIDER_SITE_OTHER): Payer: BLUE CROSS/BLUE SHIELD | Admitting: Certified Nurse Midwife

## 2016-03-24 ENCOUNTER — Encounter: Payer: Self-pay | Admitting: Certified Nurse Midwife

## 2016-03-24 VITALS — BP 102/60 | HR 68 | Resp 16 | Ht 66.0 in | Wt 132.8 lb

## 2016-03-24 DIAGNOSIS — Z0000 Encounter for general adult medical examination without abnormal findings: Secondary | ICD-10-CM

## 2016-03-24 DIAGNOSIS — Z124 Encounter for screening for malignant neoplasm of cervix: Secondary | ICD-10-CM | POA: Diagnosis not present

## 2016-03-24 DIAGNOSIS — M25511 Pain in right shoulder: Secondary | ICD-10-CM | POA: Diagnosis not present

## 2016-03-24 DIAGNOSIS — M79644 Pain in right finger(s): Secondary | ICD-10-CM | POA: Insufficient documentation

## 2016-03-24 DIAGNOSIS — Z01419 Encounter for gynecological examination (general) (routine) without abnormal findings: Secondary | ICD-10-CM | POA: Diagnosis not present

## 2016-03-24 LAB — POCT URINALYSIS DIPSTICK
Bilirubin, UA: NEGATIVE
Blood, UA: NEGATIVE
Glucose, UA: NEGATIVE
Ketones, UA: NEGATIVE
Leukocytes, UA: NEGATIVE
Nitrite, UA: NEGATIVE
Protein, UA: NEGATIVE
Urobilinogen, UA: NEGATIVE
pH, UA: 7

## 2016-03-24 NOTE — Progress Notes (Signed)
55 y.o. G24P2002 Married  Caucasian Fe here for annual exam. Menopausal no HRT. Denies vaginal bleeding or vaginal dryness. Sees PCP prn .Complaining of urinary leakage with playing tennis and coughing only. Drinks water only and adequate amounts. Has not worked on [employer] exercises at this point. Sees oncology for rectal cancer follow up, all still negative. Has screening labs with oncology and Lipids/Vitamin D with PCP. No other health issues today.  Patient's last menstrual period was 03/06/2012 (approximate).          Sexually active: Yes.    The current method of family planning is vasectomy.    Exercising: Yes.    lift weights, hike Smoker:  no  Health Maintenance: Pap:  03-13-15 neg HPV HR neg MMG:  04-11-15 category d density birads 1:neg Colonoscopy:  2015 BMD:   2017 TDaP:  2016 Shingles: no Pneumonia: no Hep C and HIV: both neg 2016 Labs: none Self breast exam: Occasional   reports that she has never smoked. She has never used smokeless tobacco. She reports that she does not drink alcohol or use drugs.  Past Medical History:  Diagnosis Date  . Allergy    latex  . Cancer ([place]) 12/26/13 bx   rectalinvasive adenocarcinoma  . Coronary artery disease   . Heart disease 2008   w/LAD and 3 stents  . 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 Hypercholesterolemia   . PONV (postoperative nausea and vomiting)     Past Surgical History:  Procedure Laterality Date  . CARDIAC SURGERY  06/2006   stents x 3  . CLOSURE OF ENTEROSTOMY, LARGE OR SMALL INTESTINE; WITH RESECTION AND ANASTOMOSIS OTHER THAN COLORECTAL  11/01/14   rectal cancer  . COLONOSCOPY WITH PROPOFOL N/A 12/26/2013   Procedure: COLONOSCOPY WITH PROPOFOL;  Surgeon: Garlan Fair, MD;  Location: [facility];  Service: Endoscopy;  Laterality: N/A;  . CORONARY ANGIOPLASTY     3 stents  . EUS N/A 01/03/2014   Procedure: LOWER ENDOSCOPIC ULTRASOUND (EUS);  Surgeon: Arta Silence, MD;  Location: [facility];  Service: Endoscopy;  Laterality: N/A;  . PROCTECTOMY W/COLONIC RESERVOIR, open low anterior resection, coloanal anastomosis, colonic J-pouch, loop ilesotomy, ERAS  05/02/14   rectal cancer    Current Outpatient Prescriptions  Medication Sig Dispense Refill  . Cyanocobalamin (VITAMIN B 12 PO) Take by mouth daily.    . Cholecalciferol (D 1000) 1000 units capsule Take 1,000 Units by mouth daily.      No current facility-administered medications for this visit.     Family History  Problem Relation Age of Onset  . Heart disease Mother   . Heart disease Father     Psychologist, forensic  . Heart disease Brother   . Marfan syndrome Brother     age 24 dissection    ROS:  Pertinent items are noted in HPI.  Otherwise, a comprehensive ROS was negative.  Exam:   BP 102/60 (BP Location: Right Arm, Patient Position: Sitting, Cuff Size: Normal)   Pulse 68   Resp 16   Ht 5\' 6"  (1.676 m)   Wt 132 lb 12.8 oz (60.2 kg)   LMP 03/06/2012 (Approximate)   BMI 21.43 kg/m  Height: 5\' 6"  (167.6 cm) Ht Readings from Last 3 Encounters:  03/24/16 5\' 6"  (1.676 m)  09/05/15 5' 2.5" (1.588 m)  08/30/15 5' 5.25" (1.657 m)    General appearance: alert, cooperative and appears stated age Head: Normocephalic, without obvious abnormality, atraumatic Neck:  no adenopathy, supple, symmetrical, trachea midline and thyroid normal to inspection and palpation Lungs: clear to auscultation bilaterally Breasts: normal appearance, no masses or tenderness, No nipple retraction or dimpling, No nipple discharge or bleeding, No axillary or supraclavicular adenopathy Heart: regular rate and rhythm Abdomen: soft, non-tender; no masses,  no organomegaly Extremities: extremities normal, atraumatic, no cyanosis or edema Skin: Skin color, texture, turgor normal. No rashes or lesions Lymph nodes: Cervical, supraclavicular, and axillary nodes normal. No abnormal inguinal nodes palpated Neurologic: Grossly normal   Pelvic:  External genitalia:  no lesions              Urethra:  normal appearing urethra with no masses, tenderness or lesions              Bartholin's and Skene's: normal                 Vagina: normal appearing vagina with normal color and discharge, no lesions, grade 2 cystocele noted, no leaking noted with cough              Cervix: multiparous appearance, no cervical motion tenderness and no lesions              Pap taken: Yes.   Bimanual Exam:  Uterus:  normal size, contour, position, consistency, mobility, non-tender              Adnexa: normal adnexa and no mass, fullness, tenderness               Rectovaginal: Confirms               Anus:  Declined due to surgery and being checked every 6 months with cancer follow up  Chaperone present: yes  A:  Well Woman with normal exam  Menopausal no HRT  Cystocele grade 2  History of adenocarcinoma of rectum/anal under oncology follow up  P:   Reviewed health and wellness pertinent to exam  Aware of need to evaluate if vaginal bleeding.  Discussed finding of cystocele and options for management with symptoms. Discussed PT pelvic floor evaluation to see if this will help. Patient has Ileana Roup as a friend and plans to work with her on pelvic floor information and exercises. If no change may consider pessary for support with exercise only. Questions addressed.  Continue follow up with MD as indicated  Pap smear with HPV reflex   counseled on breast self exam, mammography screening, adequate intake of calcium and vitamin D, diet and exercise  return annually or prn  An After Visit Summary was printed and given to the patient.

## 2016-03-24 NOTE — Patient Instructions (Signed)

## 2016-03-25 LAB — IPS PAP TEST WITH REFLEX TO HPV

## 2016-03-27 NOTE — Progress Notes (Signed)
Encounter reviewed Caio Devera, MD   

## 2016-04-13 ENCOUNTER — Ambulatory Visit
Admission: RE | Admit: 2016-04-13 | Discharge: 2016-04-13 | Disposition: A | Discharge status: Home / Self Care | Payer: BLUE CROSS/BLUE SHIELD | Source: Ambulatory Visit | Attending: Nurse Practitioner | Admitting: Nurse Practitioner

## 2016-04-13 DIAGNOSIS — Z1231 Encounter for screening mammogram for malignant neoplasm of breast: Secondary | ICD-10-CM

## 2016-04-13 DIAGNOSIS — Z23 Encounter for immunization: Secondary | ICD-10-CM | POA: Diagnosis not present

## 2016-04-14 ENCOUNTER — Other Ambulatory Visit: Payer: Self-pay | Admitting: Nurse Practitioner

## 2016-04-14 DIAGNOSIS — R928 Other abnormal and inconclusive findings on diagnostic imaging of breast: Secondary | ICD-10-CM

## 2016-04-16 ENCOUNTER — Ambulatory Visit
Admission: RE | Admit: 2016-04-16 | Discharge: 2016-04-16 | Disposition: A | Discharge status: Home / Self Care | Payer: BLUE CROSS/BLUE SHIELD | Source: Ambulatory Visit | Attending: Nurse Practitioner | Admitting: Nurse Practitioner

## 2016-04-16 DIAGNOSIS — R928 Other abnormal and inconclusive findings on diagnostic imaging of breast: Secondary | ICD-10-CM

## 2016-04-16 DIAGNOSIS — R922 Inconclusive mammogram: Secondary | ICD-10-CM | POA: Diagnosis not present

## 2016-04-17 ENCOUNTER — Encounter: Payer: Self-pay | Admitting: Nurse Practitioner

## 2016-05-05 DIAGNOSIS — C20 Malignant neoplasm of rectum: Secondary | ICD-10-CM | POA: Diagnosis not present

## 2016-08-04 DIAGNOSIS — K625 Hemorrhage of anus and rectum: Secondary | ICD-10-CM | POA: Diagnosis not present

## 2016-08-04 DIAGNOSIS — C20 Malignant neoplasm of rectum: Secondary | ICD-10-CM | POA: Diagnosis not present

## 2016-09-03 NOTE — Progress Notes (Signed)
Cardiology Office Note   Date:  09/04/2016   ID:  Melanie Cook, DOB 1961/02/15, MRN [id number]  PCP:  Burnis Medin, MD  Cardiologist:   Dorris Carnes, MD   F/U of SCAD and lipids     History of Present Illness: Melanie Cook is a 56 y.o. female with a history of coronary artery dissection of LAD in 2008  She is s/p PTCA/DES x 3  Also seen by Dr Pollie Meyer at [facility]   Has probable connective tissue disorder though not identifiable.  Also ahistory of HL and rectal CA   CT of abdomen in 2015 showed calcificationso of aorta  ? smal dissection flap of R comon iliac  Coronary calcifications noted.    Since seen the pt denies CP  Breathing is OK   Pt active   Current Meds  Medication Sig  . Cholecalciferol (D 1000) 1000 units capsule Take 1,000 Units by mouth daily.   . Cyanocobalamin (VITAMIN B 12 PO) Take by mouth daily.     Allergies:   Latex and Prochlorperazine   Past Medical History:  Diagnosis Date  . Allergy    latex  . Cancer ([place]) 12/26/13 bx   rectalinvasive adenocarcinoma  . Coronary artery disease   . Heart disease 2008   w/LAD and 3 stents  . 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 Hypercholesterolemia   . PONV (postoperative nausea and vomiting)     Past Surgical History:  Procedure Laterality Date  . CARDIAC SURGERY  06/2006   stents x 3  . CLOSURE OF ENTEROSTOMY, LARGE OR SMALL INTESTINE; WITH RESECTION AND ANASTOMOSIS OTHER THAN COLORECTAL  11/01/14   rectal cancer  . COLONOSCOPY WITH PROPOFOL N/A 12/26/2013   Procedure: COLONOSCOPY WITH PROPOFOL;  Surgeon: Garlan Fair, MD;  Location: [facility];  Service: Endoscopy;  Laterality: N/A;  . CORONARY ANGIOPLASTY     3 stents  . EUS N/A 01/03/2014   Procedure: LOWER ENDOSCOPIC ULTRASOUND (EUS);  Surgeon: Arta Silence, MD;  Location: [facility];  Service: Endoscopy;  Laterality: N/A;  . PROCTECTOMY W/COLONIC RESERVOIR, open low anterior resection, coloanal anastomosis, colonic J-pouch,  loop ilesotomy, ERAS  05/02/14   rectal cancer     Social History:  The patient  reports that she has never smoked. She has never used smokeless tobacco. She reports that she does not drink alcohol or use drugs.   Family History:  The patient's family history includes Heart disease in her brother, father, and mother; Marfan syndrome in her brother.    ROS:  Please see the history of present illness. All other systems are reviewed and  Negative to the above problem except as noted.    PHYSICAL EXAM: VS:  BP 104/62   Pulse (!) 50   Ht 5\' 6"  (1.676 m)   Wt 61.6 kg (135 lb 12.8 oz)   LMP 12/16/2013 Comment: one every 5 months  BMI 21.92 kg/m   GEN: Well nourished, well developed, in no acute distress  HEENT: normal  Neck: no JVD, carotid bruits, or masses Cardiac: RRR; no murmurs, rubs, or gallops,no edema  Respiratory:  clear to auscultation bilaterally, normal work of breathing GI: soft, nontender, nondistended, + BS  No hepatomegaly  MS: no deformity Moving all extremities   Skin: warm and dry, no rash Neuro:  Strength and sensation are intact Psych: euthymic mood, full affect   EKG:  EKG is ordered today.Sinus bradycardia 50 bpm  Lipid Panel    Component Value Date/Time   CHOL 239 (H) 09/05/2015 0859   TRIG 69.0 09/05/2015 0859   HDL 70.00 09/05/2015 0859   CHOLHDL 3 09/05/2015 0859   VLDL 13.8 09/05/2015 0859   LDLCALC 155 (H) 09/05/2015 0859      Wt Readings from Last 3 Encounters:  09/04/16 61.6 kg (135 lb 12.8 oz)  03/24/16 60.2 kg (132 lb 12.8 oz)  09/05/15 59 kg (130 lb 1 oz)      ASSESSMENT AND PLAN:  1  Hx of SCAD  No symptoms of any recurr ischemia  2  Connective tissue dz  Undefined  Pt seen by Dr Pollie Meyer in past  Normal aorta on CT scan at [facility]    3   Lipids  Will check fasting lipids today  Pt had coffee with milk    Encouraged her to stay active   F/U 1 year    Current medicines are reviewed at length with the patient today.  The patient  does not have concerns regarding medicines.  Signed, Dorris Carnes, MD  09/04/2016 8:23 AM    [address], [place], [place]  [address] Phone: [phone]; Fax: [phone]

## 2016-09-04 ENCOUNTER — Encounter: Payer: Self-pay | Admitting: Internal Medicine

## 2016-09-04 ENCOUNTER — Ambulatory Visit (INDEPENDENT_AMBULATORY_CARE_PROVIDER_SITE_OTHER): Payer: BLUE CROSS/BLUE SHIELD | Admitting: Internal Medicine

## 2016-09-04 VITALS — BP 104/62 | HR 50 | Ht 66.0 in | Wt 135.8 lb

## 2016-09-04 DIAGNOSIS — E785 Hyperlipidemia, unspecified: Secondary | ICD-10-CM

## 2016-09-04 DIAGNOSIS — I2542 Coronary artery dissection: Secondary | ICD-10-CM

## 2016-09-04 LAB — LIPID PANEL
Chol/HDL Ratio: 2.9 ratio (ref 0.0–4.4)
Cholesterol, Total: 228 mg/dL — ABNORMAL HIGH (ref 100–199)
HDL: 78 mg/dL (ref >39)
LDL Calculated: 138 mg/dL — ABNORMAL HIGH (ref 0–99)
Triglycerides: 62 mg/dL (ref 0–149)
VLDL Cholesterol Cal: 12 mg/dL (ref 5–40)

## 2016-09-04 NOTE — Patient Instructions (Signed)
Your physician recommends that you continue on your current medications as directed. Please refer to the Current Medication list given to you today.  Your physician recommends that you return for lab work today ([place])  Your physician wants you to follow-up in: 1 year with Dr. Harrington Challenger.  You will receive a reminder letter in the mail two months in advance. If you don't receive a letter, please call our office to schedule the follow-up appointment.

## 2016-09-11 ENCOUNTER — Encounter: Payer: BLUE CROSS/BLUE SHIELD | Admitting: Internal Medicine

## 2016-09-19 NOTE — Progress Notes (Signed)
Chief Complaint  Patient presents with  . Annual Exam    HPI: Patient  Melanie Cook  56 y.o. comes in today for Preventive Health Care visit   Hx rectal cancer and   Dyslipidemia  Dr Harrington Challenger  Seen at [facility]  2008 and sp ptca and des follwowed [facility]  and [place] tiss diesase   utd on pap  mammo labs per [facility] Maintenance  Topic Date Due  . INFLUENZA VACCINE  11/04/2016  . MAMMOGRAM  04/13/2018  . PAP SMEAR  03/25/2019  . TETANUS/TDAP  01/04/2025  . COLONOSCOPY  03/21/2025  . Hepatitis C Screening  Completed  . HIV Screening  Completed   Health Maintenance Review LIFESTYLE:  Exercise:  Tennis and gym  Going hiking in  [place]  A lot  Tobacco/ETS: no Alcohol:   no Sugar beverages: no Sleep: about 7 hours  Drug use: no HH of  2 pet dogs  Work: 40 week .  [facility]   [employer]  [employer]   ROS:  GEN/ HEENT: No fever, significant weight changes sweats headaches vision problems hearing changes, CV/ PULM; No chest pain shortness of breath cough, syncope,edema  change in exercise tolerance. GI /GU: No adominal pain, vomiting, change in bowel habits. No blood in the stool. No significant GU symptoms. SKIN/HEME: ,no acute skin rashes suspicious lesions or bleeding. No lymphadenopathy, nodules, masses.  NEURO/ PSYCH:  No neurologic signs such as weakness numbness. No depression anxiety. IMM/ Allergy: No unusual infections.  Allergy .   REST of 12 system review negative except as per HPI   Past Medical History:  Diagnosis Date  . Allergy    latex  . Cancer ([place]) 12/26/13 bx   rectalinvasive adenocarcinoma  . Coronary artery disease   . Heart disease 2008   w/LAD and 3 stents  . 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 Hypercholesterolemia   . PONV (postoperative nausea and vomiting)     Past Surgical History:  Procedure Laterality Date  . CARDIAC SURGERY  06/2006   stents x 3  . CLOSURE OF ENTEROSTOMY, LARGE OR SMALL INTESTINE; WITH  RESECTION AND ANASTOMOSIS OTHER THAN COLORECTAL  11/01/14   rectal cancer  . COLONOSCOPY WITH PROPOFOL N/A 12/26/2013   Procedure: COLONOSCOPY WITH PROPOFOL;  Surgeon: Garlan Fair, MD;  Location: [facility];  Service: Endoscopy;  Laterality: N/A;  . CORONARY ANGIOPLASTY     3 stents  . EUS N/A 01/03/2014   Procedure: LOWER ENDOSCOPIC ULTRASOUND (EUS);  Surgeon: Arta Silence, MD;  Location: [facility];  Service: Endoscopy;  Laterality: N/A;  . PROCTECTOMY W/COLONIC RESERVOIR, open low anterior resection, coloanal anastomosis, colonic J-pouch, loop ilesotomy, ERAS  05/02/14   rectal cancer    Family History  Problem Relation Age of Onset  . Heart disease Mother   . Heart disease Father        Psychologist, forensic  . Heart disease Brother   . Marfan syndrome Brother        age 41 dissection    Social History   Social History  . Marital status: Married    Spouse name: N/A  . Number of children: 2  . Years of education: N/A   Occupational History  .  [employer]   Social History Main Topics  . Smoking status: Never Smoker  . Smokeless tobacco: Never Used  . Alcohol use No  .  Drug use: No  . Sexual activity: Yes    Partners: Male    Birth control/ protection: Surgical     Comment: vasectomy   Other Topics Concern  . None   Social History Narrative   The patient is married with two children.Does not smoke    7 hours of sleep per night   Lives with her husband. hh of 2  Both kids in college.(2)   2 dogs in the home.      Tennis  Pre cancer treatment.   Hikes a lot    orig from  [place]. 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 Cholecalciferol (D 1000) 1000 units capsule Take 1,000 Units by mouth daily.     . Cyanocobalamin (VITAMIN B 12 PO) Take by mouth daily.     No facility-administered medications prior to visit.       EXAM:  BP 98/70 (BP Location: Right Arm, Patient Position: Sitting, Cuff Size: Normal)   Pulse (!) 52   Temp 97.6 F (36.4 C) (Oral)   Ht '5\' 6"'  (1.676 m)   Wt 134 lb 8 oz (61 kg)   LMP 12/16/2013 Comment: one every 5 months  BMI 21.71 kg/m   Body mass index is 21.71 kg/m. Wt Readings from Last 3 Encounters:  09/22/16 134 lb 8 oz (61 kg)  09/04/16 135 lb 12.8 oz (61.6 kg)  03/24/16 132 lb 12.8 oz (60.2 kg)    Physical Exam: Vital signs reviewed JOA:CZYS is a well-developed well-nourished alert cooperative    who appearsr stated age in no acute distress.  HEENT: normocephalic atraumatic , Eyes: PERRL EOM's full, conjunctiva clear, Nares: paten,t no deformity discharge or tenderness., Ears: no deformity EAC's clear TMs with normal landmarks. Mouth: clear OP, no lesions, edema.  Moist mucous membranes. Dentition in adequate repair. NECK: supple without masses, thyromegaly or bruits. CHEST/PULM:  Clear to auscultation and percussion breath sounds equal no wheeze , rales or rhonchi. No chest wall deformities or tenderness. Breast: normal by inspection . No dimpling, discharge, masses, tenderness or discharge . CV: PMI is nondisplaced, S1 S2 no gallops, murmurs, rubs. Peripheral pulses are full without delay.No JVD .  ABDOMEN: Bowel sounds normal nontender  No guard or rebound, no hepato splenomegal no CVA tenderness.  No hernia. Extremtities:  No clubbing cyanosis or edema, no acute joint swelling or redness no focal atrophy NEURO:  Oriented x3, cranial nerves 3-12 appear to be intact, no obvious focal weakness,gait within normal limits no abnormal reflexes or asymmetrical SKIN: No acute rashes normal turgor, color, no bruising or petechiae. PSYCH: Oriented, good eye contact, no obvious depression anxiety, cognition and judgment appear normal. LN: no cervical axillary iadenopathy  Lab Results  Component Value Date   GLUCOSE 84 04/17/2013   CHOL 228 (H) 09/04/2016   TRIG 62  09/04/2016   HDL 78 09/04/2016   LDLCALC 138 (H) 09/04/2016   ALT 14 04/17/2013   AST 22 04/17/2013   NA 140 04/17/2013   K 4.7 04/17/2013   CL 105 04/17/2013   CREATININE 0.70 04/17/2013   BUN 10 04/17/2013   CO2 30 04/17/2013   TSH 1.34 09/05/2015   HGBA1C 5.5 09/05/2015   = BP Readings from Last 3 Encounters:  09/22/16 98/70  09/04/16 104/62  03/24/16 102/60    Lab results  From  [facility] reviewed  ASSESSMENT AND PLAN:  Discussed the following assessment and plan:  Visit for preventive health examination - utcd on parameters cont healthy ls.  Shingrix discussed   History of rectal or anal cancer  Hx of arterial dissection Hx rectal cancer under care at [facility]   No fracture other risk hx   Followed dr Harrington Challenger for cad hx and des   No concerns  Patient Care Team: Jenniffer Vessels, Standley Brooking, MD as PCP - General (Internal Medicine) Filomena Jungling, MD as Referring Physician (Internal Medicine) Hester Mates Tania Ade, MD as Referring Physician (Surgical Oncology) Fay Records, MD as Consulting Physician (Cardiology) Kem Boroughs, FNP as Nurse Practitioner (Nurse Practitioner) Patient Instructions  Glad you are doing well. Check into [facility] vaccine  And make injection appt here or get at  Pharmacy wherever less expensive for you     Preventive Care 40-64 Years, Female Preventive care refers to lifestyle choices and visits with your health care provider that can promote health and wellness. What does preventive care include?  A yearly physical exam. This is also called an annual well check.  Dental exams once or twice a year.  Routine eye exams. Ask your health care provider how often you should have your eyes checked.  Personal lifestyle choices, including: ? Daily care of your teeth and gums. ? Regular physical activity. ? Eating a healthy diet. ? Avoiding tobacco and drug use. ? Limiting alcohol use. ? Practicing safe sex. ? Taking low-dose aspirin daily starting at age  73. ? Taking vitamin and mineral supplements as recommended by your health care provider. What happens during an annual well check? The services and screenings done by your health care provider during your annual well check will depend on your age, overall health, lifestyle risk factors, and family history of disease. Counseling Your health care provider may ask you questions about your:  Alcohol use.  Tobacco use.  Drug use.  Emotional well-being.  Home and relationship well-being.  Sexual activity.  Eating habits.  Work and work Statistician.  Method of birth control.  Menstrual cycle.  Pregnancy history.  Screening You may have the following tests or measurements:  Height, weight, and BMI.  Blood pressure.  Lipid and cholesterol levels. These may be checked every 5 years, or more frequently if you are over 106 years old.  Skin check.  Lung cancer screening. You may have this screening every year starting at age 81 if you have a 30-pack-year history of smoking and currently smoke or have quit within the past 15 years.  Fecal occult blood test (FOBT) of the stool. You may have this test every year starting at age 24.  Flexible sigmoidoscopy or colonoscopy. You may have a sigmoidoscopy every 5 years or a colonoscopy every 10 years starting at age 69.  Hepatitis C blood test.  Hepatitis B blood test.  Sexually transmitted disease (STD) testing.  Diabetes screening. This is done by checking your blood sugar (glucose) after you have not eaten for a while (fasting). You may have this done every 1-3 years.  Mammogram. This may be done every 1-2 years. Talk to your health care provider about when you should start having regular mammograms. This may depend on whether you have a family history of breast cancer.  BRCA-related cancer screening. This may be done if you have a family history of breast, ovarian, tubal, or peritoneal cancers.  Pelvic exam and Pap test.  This  may be done every 3 years starting at age 70. Starting at age 50, this may be done every 5 years if you have a Pap test in combination with an HPV test.  Bone density scan. This is done to screen for osteoporosis. You may have this scan if you are at high risk for osteoporosis.  Discuss your test results, treatment options, and if necessary, the need for more tests with your health care provider. Vaccines Your health care provider may recommend certain vaccines, such as:  Influenza vaccine. This is recommended every year.  Tetanus, diphtheria, and acellular pertussis (Tdap, Td) vaccine. You may need a Td booster every 10 years.  Varicella vaccine. You may need this if you have not been vaccinated.  Zoster vaccine. You may need this after age 70.  Measles, mumps, and rubella (MMR) vaccine. You may need at least one dose of MMR if you were born in 1957 or later. You may also need a second dose.  Pneumococcal 13-valent conjugate (PCV13) vaccine. You may need this if you have certain conditions and were not previously vaccinated.  Pneumococcal polysaccharide (PPSV23) vaccine. You may need one or two doses if you smoke cigarettes or if you have certain conditions.  Meningococcal vaccine. You may need this if you have certain conditions.  Hepatitis A vaccine. You may need this if you have certain conditions or if you travel or work in places where you may be exposed to hepatitis A.  Hepatitis B vaccine. You may need this if you have certain conditions or if you travel or work in places where you may be exposed to hepatitis B.  Haemophilus influenzae type b (Hib) vaccine. You may need this if you have certain conditions.  Talk to your health care provider about which screenings and vaccines you need and how often you need them. This information is not intended to replace advice given to you by your health care provider. Make sure you discuss any questions you have with your health care  provider. Document Released: 04/19/2015 Document Revised: 12/11/2015 Document Reviewed: 01/22/2015 Elsevier Interactive Patient Education  2017 [place]. Rasheida Broden M.D.

## 2016-09-22 ENCOUNTER — Encounter: Payer: Self-pay | Admitting: Internal Medicine

## 2016-09-22 ENCOUNTER — Ambulatory Visit (INDEPENDENT_AMBULATORY_CARE_PROVIDER_SITE_OTHER): Payer: BLUE CROSS/BLUE SHIELD | Admitting: Internal Medicine

## 2016-09-22 VITALS — BP 98/70 | HR 52 | Temp 97.6°F | Ht 66.0 in | Wt 134.5 lb

## 2016-09-22 DIAGNOSIS — Z0000 Encounter for general adult medical examination without abnormal findings: Secondary | ICD-10-CM

## 2016-09-22 DIAGNOSIS — Z8679 Personal history of other diseases of the circulatory system: Secondary | ICD-10-CM

## 2016-09-22 DIAGNOSIS — Z85048 Personal history of other malignant neoplasm of rectum, rectosigmoid junction, and anus: Secondary | ICD-10-CM

## 2016-09-22 NOTE — Patient Instructions (Addendum)
Glad you are doing well. Check into [facility] vaccine  And make injection appt here or get at  Pharmacy wherever less expensive for you     Preventive Care 40-64 Years, Female Preventive care refers to lifestyle choices and visits with your health care provider that can promote health and wellness. What does preventive care include?  A yearly physical exam. This is also called an annual well check.  Dental exams once or twice a year.  Routine eye exams. Ask your health care provider how often you should have your eyes checked.  Personal lifestyle choices, including: ? Daily care of your teeth and gums. ? Regular physical activity. ? Eating a healthy diet. ? Avoiding tobacco and drug use. ? Limiting alcohol use. ? Practicing safe sex. ? Taking low-dose aspirin daily starting at age 33. ? Taking vitamin and mineral supplements as recommended by your health care provider. What happens during an annual well check? The services and screenings done by your health care provider during your annual well check will depend on your age, overall health, lifestyle risk factors, and family history of disease. Counseling Your health care provider may ask you questions about your:  Alcohol use.  Tobacco use.  Drug use.  Emotional well-being.  Home and relationship well-being.  Sexual activity.  Eating habits.  Work and work Statistician.  Method of birth control.  Menstrual cycle.  Pregnancy history.  Screening You may have the following tests or measurements:  Height, weight, and BMI.  Blood pressure.  Lipid and cholesterol levels. These may be checked every 5 years, or more frequently if you are over 16 years old.  Skin check.  Lung cancer screening. You may have this screening every year starting at age 68 if you have a 30-pack-year history of smoking and currently smoke or have quit within the past 15 years.  Fecal occult blood test (FOBT) of the stool. You may have  this test every year starting at age 49.  Flexible sigmoidoscopy or colonoscopy. You may have a sigmoidoscopy every 5 years or a colonoscopy every 10 years starting at age 17.  Hepatitis C blood test.  Hepatitis B blood test.  Sexually transmitted disease (STD) testing.  Diabetes screening. This is done by checking your blood sugar (glucose) after you have not eaten for a while (fasting). You may have this done every 1-3 years.  Mammogram. This may be done every 1-2 years. Talk to your health care provider about when you should start having regular mammograms. This may depend on whether you have a family history of breast cancer.  BRCA-related cancer screening. This may be done if you have a family history of breast, ovarian, tubal, or peritoneal cancers.  Pelvic exam and Pap test. This may be done every 3 years starting at age 42. Starting at age 55, this may be done every 5 years if you have a Pap test in combination with an HPV test.  Bone density scan. This is done to screen for osteoporosis. You may have this scan if you are at high risk for osteoporosis.  Discuss your test results, treatment options, and if necessary, the need for more tests with your health care provider. Vaccines Your health care provider may recommend certain vaccines, such as:  Influenza vaccine. This is recommended every year.  Tetanus, diphtheria, and acellular pertussis (Tdap, Td) vaccine. You may need a Td booster every 10 years.  Varicella vaccine. You may need this if you have not been vaccinated.  Zoster  vaccine. You may need this after age 73.  Measles, mumps, and rubella (MMR) vaccine. You may need at least one dose of MMR if you were born in 1957 or later. You may also need a second dose.  Pneumococcal 13-valent conjugate (PCV13) vaccine. You may need this if you have certain conditions and were not previously vaccinated.  Pneumococcal polysaccharide (PPSV23) vaccine. You may need one or two  doses if you smoke cigarettes or if you have certain conditions.  Meningococcal vaccine. You may need this if you have certain conditions.  Hepatitis A vaccine. You may need this if you have certain conditions or if you travel or work in places where you may be exposed to hepatitis A.  Hepatitis B vaccine. You may need this if you have certain conditions or if you travel or work in places where you may be exposed to hepatitis B.  Haemophilus influenzae type b (Hib) vaccine. You may need this if you have certain conditions.  Talk to your health care provider about which screenings and vaccines you need and how often you need them. This information is not intended to replace advice given to you by your health care provider. Make sure you discuss any questions you have with your health care provider. Document Released: 04/19/2015 Document Revised: 12/11/2015 Document Reviewed: 01/22/2015 Elsevier Interactive Patient Education  2017 [employer].

## 2016-10-27 DIAGNOSIS — M9903 Segmental and somatic dysfunction of lumbar region: Secondary | ICD-10-CM | POA: Diagnosis not present

## 2016-10-27 DIAGNOSIS — M9901 Segmental and somatic dysfunction of cervical region: Secondary | ICD-10-CM | POA: Diagnosis not present

## 2016-10-27 DIAGNOSIS — M9902 Segmental and somatic dysfunction of thoracic region: Secondary | ICD-10-CM | POA: Diagnosis not present

## 2016-10-27 DIAGNOSIS — M9907 Segmental and somatic dysfunction of upper extremity: Secondary | ICD-10-CM | POA: Diagnosis not present

## 2016-11-02 DIAGNOSIS — M9901 Segmental and somatic dysfunction of cervical region: Secondary | ICD-10-CM | POA: Diagnosis not present

## 2016-11-02 DIAGNOSIS — M9902 Segmental and somatic dysfunction of thoracic region: Secondary | ICD-10-CM | POA: Diagnosis not present

## 2016-11-02 DIAGNOSIS — M9907 Segmental and somatic dysfunction of upper extremity: Secondary | ICD-10-CM | POA: Diagnosis not present

## 2016-11-02 DIAGNOSIS — M9903 Segmental and somatic dysfunction of lumbar region: Secondary | ICD-10-CM | POA: Diagnosis not present

## 2016-11-12 DIAGNOSIS — M9901 Segmental and somatic dysfunction of cervical region: Secondary | ICD-10-CM | POA: Diagnosis not present

## 2016-11-12 DIAGNOSIS — M9903 Segmental and somatic dysfunction of lumbar region: Secondary | ICD-10-CM | POA: Diagnosis not present

## 2016-11-12 DIAGNOSIS — M9902 Segmental and somatic dysfunction of thoracic region: Secondary | ICD-10-CM | POA: Diagnosis not present

## 2016-11-12 DIAGNOSIS — M9907 Segmental and somatic dysfunction of upper extremity: Secondary | ICD-10-CM | POA: Diagnosis not present

## 2016-11-17 DIAGNOSIS — M9901 Segmental and somatic dysfunction of cervical region: Secondary | ICD-10-CM | POA: Diagnosis not present

## 2016-11-17 DIAGNOSIS — M9902 Segmental and somatic dysfunction of thoracic region: Secondary | ICD-10-CM | POA: Diagnosis not present

## 2016-11-17 DIAGNOSIS — M9907 Segmental and somatic dysfunction of upper extremity: Secondary | ICD-10-CM | POA: Diagnosis not present

## 2016-11-17 DIAGNOSIS — M9903 Segmental and somatic dysfunction of lumbar region: Secondary | ICD-10-CM | POA: Diagnosis not present

## 2016-11-19 DIAGNOSIS — M9901 Segmental and somatic dysfunction of cervical region: Secondary | ICD-10-CM | POA: Diagnosis not present

## 2016-11-19 DIAGNOSIS — M9903 Segmental and somatic dysfunction of lumbar region: Secondary | ICD-10-CM | POA: Diagnosis not present

## 2016-11-19 DIAGNOSIS — M9907 Segmental and somatic dysfunction of upper extremity: Secondary | ICD-10-CM | POA: Diagnosis not present

## 2016-11-19 DIAGNOSIS — M9902 Segmental and somatic dysfunction of thoracic region: Secondary | ICD-10-CM | POA: Diagnosis not present

## 2016-11-24 DIAGNOSIS — M9901 Segmental and somatic dysfunction of cervical region: Secondary | ICD-10-CM | POA: Diagnosis not present

## 2016-11-24 DIAGNOSIS — M9907 Segmental and somatic dysfunction of upper extremity: Secondary | ICD-10-CM | POA: Diagnosis not present

## 2016-11-24 DIAGNOSIS — M9903 Segmental and somatic dysfunction of lumbar region: Secondary | ICD-10-CM | POA: Diagnosis not present

## 2016-11-24 DIAGNOSIS — M9902 Segmental and somatic dysfunction of thoracic region: Secondary | ICD-10-CM | POA: Diagnosis not present

## 2016-11-26 DIAGNOSIS — M9907 Segmental and somatic dysfunction of upper extremity: Secondary | ICD-10-CM | POA: Diagnosis not present

## 2016-11-26 DIAGNOSIS — M9901 Segmental and somatic dysfunction of cervical region: Secondary | ICD-10-CM | POA: Diagnosis not present

## 2016-11-26 DIAGNOSIS — M9902 Segmental and somatic dysfunction of thoracic region: Secondary | ICD-10-CM | POA: Diagnosis not present

## 2016-11-26 DIAGNOSIS — M9903 Segmental and somatic dysfunction of lumbar region: Secondary | ICD-10-CM | POA: Diagnosis not present

## 2016-12-22 DIAGNOSIS — M9907 Segmental and somatic dysfunction of upper extremity: Secondary | ICD-10-CM | POA: Diagnosis not present

## 2016-12-22 DIAGNOSIS — M9901 Segmental and somatic dysfunction of cervical region: Secondary | ICD-10-CM | POA: Diagnosis not present

## 2016-12-22 DIAGNOSIS — M9903 Segmental and somatic dysfunction of lumbar region: Secondary | ICD-10-CM | POA: Diagnosis not present

## 2016-12-22 DIAGNOSIS — M9902 Segmental and somatic dysfunction of thoracic region: Secondary | ICD-10-CM | POA: Diagnosis not present

## 2016-12-24 DIAGNOSIS — M9903 Segmental and somatic dysfunction of lumbar region: Secondary | ICD-10-CM | POA: Diagnosis not present

## 2016-12-24 DIAGNOSIS — M9901 Segmental and somatic dysfunction of cervical region: Secondary | ICD-10-CM | POA: Diagnosis not present

## 2016-12-24 DIAGNOSIS — M9902 Segmental and somatic dysfunction of thoracic region: Secondary | ICD-10-CM | POA: Diagnosis not present

## 2016-12-24 DIAGNOSIS — M9907 Segmental and somatic dysfunction of upper extremity: Secondary | ICD-10-CM | POA: Diagnosis not present

## 2016-12-25 ENCOUNTER — Encounter: Payer: Self-pay | Admitting: Internal Medicine

## 2016-12-29 DIAGNOSIS — M9903 Segmental and somatic dysfunction of lumbar region: Secondary | ICD-10-CM | POA: Diagnosis not present

## 2016-12-29 DIAGNOSIS — M9902 Segmental and somatic dysfunction of thoracic region: Secondary | ICD-10-CM | POA: Diagnosis not present

## 2016-12-29 DIAGNOSIS — M9901 Segmental and somatic dysfunction of cervical region: Secondary | ICD-10-CM | POA: Diagnosis not present

## 2016-12-29 DIAGNOSIS — M9907 Segmental and somatic dysfunction of upper extremity: Secondary | ICD-10-CM | POA: Diagnosis not present

## 2016-12-31 DIAGNOSIS — M9907 Segmental and somatic dysfunction of upper extremity: Secondary | ICD-10-CM | POA: Diagnosis not present

## 2016-12-31 DIAGNOSIS — M9901 Segmental and somatic dysfunction of cervical region: Secondary | ICD-10-CM | POA: Diagnosis not present

## 2016-12-31 DIAGNOSIS — M9903 Segmental and somatic dysfunction of lumbar region: Secondary | ICD-10-CM | POA: Diagnosis not present

## 2016-12-31 DIAGNOSIS — M9902 Segmental and somatic dysfunction of thoracic region: Secondary | ICD-10-CM | POA: Diagnosis not present

## 2017-01-05 DIAGNOSIS — M9903 Segmental and somatic dysfunction of lumbar region: Secondary | ICD-10-CM | POA: Diagnosis not present

## 2017-01-05 DIAGNOSIS — M9907 Segmental and somatic dysfunction of upper extremity: Secondary | ICD-10-CM | POA: Diagnosis not present

## 2017-01-05 DIAGNOSIS — M9901 Segmental and somatic dysfunction of cervical region: Secondary | ICD-10-CM | POA: Diagnosis not present

## 2017-01-05 DIAGNOSIS — M9902 Segmental and somatic dysfunction of thoracic region: Secondary | ICD-10-CM | POA: Diagnosis not present

## 2017-01-12 DIAGNOSIS — M9902 Segmental and somatic dysfunction of thoracic region: Secondary | ICD-10-CM | POA: Diagnosis not present

## 2017-01-12 DIAGNOSIS — M9901 Segmental and somatic dysfunction of cervical region: Secondary | ICD-10-CM | POA: Diagnosis not present

## 2017-01-12 DIAGNOSIS — M9907 Segmental and somatic dysfunction of upper extremity: Secondary | ICD-10-CM | POA: Diagnosis not present

## 2017-01-12 DIAGNOSIS — M9903 Segmental and somatic dysfunction of lumbar region: Secondary | ICD-10-CM | POA: Diagnosis not present

## 2017-01-26 DIAGNOSIS — M9902 Segmental and somatic dysfunction of thoracic region: Secondary | ICD-10-CM | POA: Diagnosis not present

## 2017-01-26 DIAGNOSIS — M9901 Segmental and somatic dysfunction of cervical region: Secondary | ICD-10-CM | POA: Diagnosis not present

## 2017-01-26 DIAGNOSIS — M9907 Segmental and somatic dysfunction of upper extremity: Secondary | ICD-10-CM | POA: Diagnosis not present

## 2017-01-26 DIAGNOSIS — M9903 Segmental and somatic dysfunction of lumbar region: Secondary | ICD-10-CM | POA: Diagnosis not present

## 2017-02-02 DIAGNOSIS — M9901 Segmental and somatic dysfunction of cervical region: Secondary | ICD-10-CM | POA: Diagnosis not present

## 2017-02-02 DIAGNOSIS — M9907 Segmental and somatic dysfunction of upper extremity: Secondary | ICD-10-CM | POA: Diagnosis not present

## 2017-02-02 DIAGNOSIS — M9903 Segmental and somatic dysfunction of lumbar region: Secondary | ICD-10-CM | POA: Diagnosis not present

## 2017-02-02 DIAGNOSIS — M9902 Segmental and somatic dysfunction of thoracic region: Secondary | ICD-10-CM | POA: Diagnosis not present

## 2017-02-08 DIAGNOSIS — D225 Melanocytic nevi of trunk: Secondary | ICD-10-CM | POA: Diagnosis not present

## 2017-02-08 DIAGNOSIS — L821 Other seborrheic keratosis: Secondary | ICD-10-CM | POA: Diagnosis not present

## 2017-02-08 DIAGNOSIS — Z23 Encounter for immunization: Secondary | ICD-10-CM | POA: Diagnosis not present

## 2017-02-08 DIAGNOSIS — L814 Other melanin hyperpigmentation: Secondary | ICD-10-CM | POA: Diagnosis not present

## 2017-02-08 DIAGNOSIS — D1800 Hemangioma unspecified site: Secondary | ICD-10-CM | POA: Diagnosis not present

## 2017-02-09 DIAGNOSIS — C20 Malignant neoplasm of rectum: Secondary | ICD-10-CM | POA: Diagnosis not present

## 2017-03-18 ENCOUNTER — Other Ambulatory Visit: Payer: Self-pay | Admitting: Certified Nurse Midwife

## 2017-03-18 DIAGNOSIS — Z1231 Encounter for screening mammogram for malignant neoplasm of breast: Secondary | ICD-10-CM

## 2017-03-24 ENCOUNTER — Other Ambulatory Visit (HOSPITAL_COMMUNITY)
Admission: RE | Admit: 2017-03-24 | Discharge: 2017-03-24 | Disposition: A | Discharge status: Home / Self Care | Payer: BLUE CROSS/BLUE SHIELD | Source: Ambulatory Visit | Attending: Obstetrics & Gynecology | Admitting: Obstetrics & Gynecology

## 2017-03-24 ENCOUNTER — Other Ambulatory Visit: Payer: Self-pay

## 2017-03-24 ENCOUNTER — Ambulatory Visit (INDEPENDENT_AMBULATORY_CARE_PROVIDER_SITE_OTHER): Payer: BLUE CROSS/BLUE SHIELD | Admitting: Certified Nurse Midwife

## 2017-03-24 ENCOUNTER — Encounter: Payer: Self-pay | Admitting: Certified Nurse Midwife

## 2017-03-24 VITALS — BP 104/64 | HR 60 | Resp 16 | Ht 65.25 in | Wt 133.0 lb

## 2017-03-24 DIAGNOSIS — N8110 Cystocele, unspecified: Secondary | ICD-10-CM | POA: Diagnosis not present

## 2017-03-24 DIAGNOSIS — Z124 Encounter for screening for malignant neoplasm of cervix: Secondary | ICD-10-CM

## 2017-03-24 DIAGNOSIS — N951 Menopausal and female climacteric states: Secondary | ICD-10-CM

## 2017-03-24 DIAGNOSIS — Z01419 Encounter for gynecological examination (general) (routine) without abnormal findings: Secondary | ICD-10-CM

## 2017-03-24 NOTE — Progress Notes (Signed)
56 y.o. [id number] Married  Caucasian Fe here for annual exam. Had follow up at [facility] recently for rectal scope and all clear, on 6 month follow up from rectal cancer. Menopausal no vaginal dryness or bleeding. Occasional hot flashes, no issues. Sees Dr. Regis Bill for aex and labs, all stable. Working on weight maintenance and exercise. Still having urinary incontinence with playing tennis only. Wears Depends, no other health issues today.  Patient's last menstrual period was 12/16/2013.          Sexually active: Yes.    The current method of family planning is vasectomy.    Exercising: Yes.    walking & tennis Smoker:  no  Health Maintenance: Pap:  03-13-15 neg HPV HR neg, 03-24-16 neg History of Abnormal Pap: no MMG: 1/18 bilateral & rt breast category c density birads 2:neg Self Breast exams: occ Colonoscopy:  2016 BMD:   2017 TDaP:  2016 Shingles: no Pneumonia: no Hep C and HIV: both neg 2016 Labs: no   reports that  has never smoked. she has never used smokeless tobacco. She reports that she does not drink alcohol or use drugs.  Past Medical History:  Diagnosis Date  . Allergy    latex  . Cancer ([place]) 12/26/13 bx   rectalinvasive adenocarcinoma  . Coronary artery disease   . Heart disease 2008   w/LAD and 3 stents  . 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 Hypercholesterolemia   . PONV (postoperative nausea and vomiting)     Past Surgical History:  Procedure Laterality Date  . CARDIAC SURGERY  06/2006   stents x 3  . CLOSURE OF ENTEROSTOMY, LARGE OR SMALL INTESTINE; WITH RESECTION AND ANASTOMOSIS OTHER THAN COLORECTAL  11/01/14   rectal cancer  . COLONOSCOPY WITH PROPOFOL N/A 12/26/2013   Procedure: COLONOSCOPY WITH PROPOFOL;  Surgeon: Garlan Fair, MD;  Location: [facility];  Service: Endoscopy;  Laterality: N/A;  . CORONARY ANGIOPLASTY     3 stents  . EUS N/A 01/03/2014   Procedure: LOWER ENDOSCOPIC ULTRASOUND (EUS);  Surgeon: Arta Silence, MD;   Location: [facility];  Service: Endoscopy;  Laterality: N/A;  . PROCTECTOMY W/COLONIC RESERVOIR, open low anterior resection, coloanal anastomosis, colonic J-pouch, loop ilesotomy, ERAS  05/02/14   rectal cancer    Current Outpatient Medications  Medication Sig Dispense Refill  . Cholecalciferol (D 1000) 1000 units capsule Take 1,000 Units by mouth daily.     . Cyanocobalamin (VITAMIN B 12 PO) Take by mouth daily.     No current facility-administered medications for this visit.     Family History  Problem Relation Age of Onset  . Heart disease Mother   . Heart disease Father        Psychologist, forensic  . Heart disease Brother   . Marfan syndrome Brother        age 107 dissection    ROS:  Pertinent items are noted in HPI.  Otherwise, a comprehensive ROS was negative.  Exam:   BP 104/64   Pulse 60   Resp 16   Ht 5' 5.25" (1.657 m)   Wt 133 lb (60.3 kg)   LMP 12/16/2013 Comment: one every 5 months  BMI 21.96 kg/m  Height: 5' 5.25" (165.7 cm) Ht Readings from Last 3 Encounters:  03/24/17 5' 5.25" (1.657 m)  09/22/16 5\' 6"  (1.676 m)  09/04/16 5\' 6"  (1.676 m)    General appearance: alert, cooperative and appears stated age Head: Normocephalic,  without obvious abnormality, atraumatic Neck: no adenopathy, supple, symmetrical, trachea midline and thyroid normal to inspection and palpation Lungs: clear to auscultation bilaterally Breasts: normal appearance, no masses or tenderness, No nipple retraction or dimpling, No nipple discharge or bleeding, No axillary or supraclavicular adenopathy Heart: regular rate and rhythm Abdomen: soft, non-tender; no masses,  no organomegaly Extremities: extremities normal, atraumatic, no cyanosis or edema Skin: Skin color, texture, turgor normal. No rashes or lesions Lymph nodes: Cervical, supraclavicular, and axillary nodes normal. No abnormal inguinal nodes palpated Neurologic: Grossly normal   Pelvic: External genitalia:  no lesions               Urethra:  normal appearing urethra with no masses, tenderness or lesions              Bartholin's and Skene's: normal                 Vagina: normal appearing vagina with normal color and discharge, no lesions              Cervix: no cervical motion tenderness, no lesions and normal appearance              Pap taken: Yes.   Bimanual Exam:  Uterus:  normal size, contour, position, consistency, mobility, non-tender              Adnexa: normal adnexa and no mass, fullness, tenderness               Rectovaginal: Confirms               Anus:  Normal appearance, declines due to recent anal exam   Chaperone present: yes  A:  Well Woman with normal exam  Menopausal  No HRT  Grade 2-3 cystocele  History of anal cancer 2016 now on 6 month follow up  P:   Reviewed health and wellness pertinent to exam  Discussed need to advise if vaginal bleeding  Discussed finding which she is aware of with cystocele and discussed possible pessary use for support. Also discussed possible repair, but would need evaluation with MD. Questions addressed. She will advise and may be [facility] at [facility].  Pap smear: yes  counseled on breast self exam, mammography screening, menopause, adequate intake of calcium and vitamin D, diet and exercise  return annually or prn  An After Visit Summary was printed and given to the patient.

## 2017-03-24 NOTE — Patient Instructions (Signed)
EXERCISE AND DIET:  We recommended that you start or continue a regular exercise program for good health. Regular exercise means any activity that makes your heart beat faster and makes you sweat.  We recommend exercising at least 30 minutes per day at least 3 days a week, preferably 4 or 5.  We also recommend a diet low in fat and sugar.  Inactivity, poor dietary choices and obesity can cause diabetes, heart attack, stroke, and kidney damage, among others.    ALCOHOL AND SMOKING:  Women should limit their alcohol intake to no more than 7 drinks/beers/glasses of wine (combined, not each!) per week. Moderation of alcohol intake to this level decreases your risk of breast cancer and liver damage. And of course, no recreational drugs are part of a healthy lifestyle.  And absolutely no smoking or even second hand smoke. Most people know smoking can cause heart and lung diseases, but did you know it also contributes to weakening of your bones? Aging of your skin?  Yellowing of your teeth and nails?  CALCIUM AND VITAMIN D:  Adequate intake of calcium and Vitamin D are recommended.  The recommendations for exact amounts of these supplements seem to change often, but generally speaking 600 mg of calcium (either carbonate or citrate) and 800 units of Vitamin D per day seems prudent. Certain women may benefit from higher intake of Vitamin D.  If you are among these women, your doctor will have told you during your visit.    PAP SMEARS:  Pap smears, to check for cervical cancer or precancers,  have traditionally been done yearly, although recent scientific advances have shown that most women can have pap smears less often.  However, every woman still should have a physical exam from her gynecologist every year. It will include a breast check, inspection of the vulva and vagina to check for abnormal growths or skin changes, a visual exam of the cervix, and then an exam to evaluate the size and shape of the uterus and  ovaries.  And after 55 years of age, a rectal exam is indicated to check for rectal cancers. We will also provide age appropriate advice regarding health maintenance, like when you should have certain vaccines, screening for sexually transmitted diseases, bone density testing, colonoscopy, mammograms, etc.   MAMMOGRAMS:  All women over 40 years old should have a yearly mammogram. Many facilities now offer a "3D" mammogram, which may cost around $50 extra out of pocket. If possible,  we recommend you accept the option to have the 3D mammogram performed.  It both reduces the number of women who will be called back for extra views which then turn out to be normal, and it is better than the routine mammogram at detecting truly abnormal areas.    COLONOSCOPY:  Colonoscopy to screen for colon cancer is recommended for all women at age 50.  We know, you hate the idea of the prep.  We agree, BUT, having colon cancer and not knowing it is worse!!  Colon cancer so often starts as a polyp that can be seen and removed at colonscopy, which can quite literally save your life!  And if your first colonoscopy is normal and you have no family history of colon cancer, most women don't have to have it again for 10 years.  Once every ten years, you can do something that may end up saving your life, right?  We will be happy to help you get it scheduled when you are ready.    Be sure to check your insurance coverage so you understand how much it will cost.  It may be covered as a preventative service at no cost, but you should check your particular policy.     About Cystocele  Overview  The pelvic organs, including the bladder, are normally supported by pelvic floor muscles and ligaments.  When these muscles and ligaments are stretched, weakened or torn, the wall between the bladder and the vagina sags or herniates causing a prolapse, sometimes called a cystocele.  This condition may cause discomfort and problems with emptying  the bladder.  It can be present in various stages.  Some people are not aware of the changes.  Others may notice changes at the vaginal opening or a feeling of the bladder dropping outside the body.  Causes of a Cystocele  A cystocele is usually caused by muscle straining or stretching during childbirth.  In addition, cystocele is more common after menopause, because the hormone estrogen helps keep the elastic tissues around the pelvic organs strong.  A cystocele is more likely to occur when levels of estrogen decrease.  Other causes include: heavy lifting, chronic coughing, previous pelvic surgery and obesity.  Symptoms  A bladder that has dropped from its normal position may cause: unwanted urine leakage (stress incontinence), frequent urination or urge to urinate, incomplete emptying of the bladder (not feeling bladder relief after emptying), pain or discomfort in the vagina, pelvis, groin, lower back or lower abdomen and frequent urinary tract infections.  Mild cases may not cause any symptoms.  Treatment Options  Pelvic floor (Kegel) exercises:  Strength training the muscles in your genital area  Behavioral changes: Treating and preventing constipation, taking time to empty your bladder properly, learning to lift properly and/or avoid heavy lifting when possible, stopping smoking, avoiding weight gain and treating a chronic cough or bronchitis.  A pessary: A vaginal support device is sometimes used to help pelvic support caused by muscle and ligament changes.  Surgery: Surgical repair may be necessary if symptoms cannot be managed with exercise, behavioral changes and a pessary.  Surgery is usually considered for severe cases.   2007, [employer] 

## 2017-03-29 LAB — CYTOLOGY - PAP: Diagnosis: NEGATIVE

## 2017-04-19 ENCOUNTER — Ambulatory Visit
Admission: RE | Admit: 2017-04-19 | Discharge: 2017-04-19 | Disposition: A | Discharge status: Home / Self Care | Payer: BLUE CROSS/BLUE SHIELD | Source: Ambulatory Visit | Attending: Certified Nurse Midwife | Admitting: Certified Nurse Midwife

## 2017-04-19 DIAGNOSIS — Z1231 Encounter for screening mammogram for malignant neoplasm of breast: Secondary | ICD-10-CM | POA: Diagnosis not present

## 2017-05-18 ENCOUNTER — Telehealth: Payer: Self-pay | Admitting: Internal Medicine

## 2017-05-18 NOTE — Telephone Encounter (Signed)
Copied from [place] [id number]. Topic: Inquiry >> May 18, 2017  1:12 PM Neva Seat wrote: Pt wants new/latest shingles vaccine.  Please call pt to discuss.

## 2017-05-18 NOTE — Telephone Encounter (Signed)
Nurse visit scheduled for first Shingrix injection. Patient instructed to verify insurance coverage prior to appt and verbalized understanding.

## 2017-05-21 ENCOUNTER — Ambulatory Visit (INDEPENDENT_AMBULATORY_CARE_PROVIDER_SITE_OTHER): Payer: BLUE CROSS/BLUE SHIELD

## 2017-05-21 DIAGNOSIS — Z23 Encounter for immunization: Secondary | ICD-10-CM

## 2017-05-21 NOTE — Progress Notes (Signed)
Per orders of Dr. Regis Bill injection of Shingrix given by Wyvonne Lenz. Patient tolerated injection well.

## 2017-07-07 DIAGNOSIS — M542 Cervicalgia: Secondary | ICD-10-CM | POA: Diagnosis not present

## 2017-07-07 DIAGNOSIS — M5412 Radiculopathy, cervical region: Secondary | ICD-10-CM | POA: Diagnosis not present

## 2017-08-10 DIAGNOSIS — C20 Malignant neoplasm of rectum: Secondary | ICD-10-CM | POA: Diagnosis not present

## 2017-08-19 ENCOUNTER — Ambulatory Visit (INDEPENDENT_AMBULATORY_CARE_PROVIDER_SITE_OTHER): Payer: BLUE CROSS/BLUE SHIELD | Admitting: Family Medicine

## 2017-08-19 DIAGNOSIS — Z23 Encounter for immunization: Secondary | ICD-10-CM

## 2017-10-25 DIAGNOSIS — F4323 Adjustment disorder with mixed anxiety and depressed mood: Secondary | ICD-10-CM | POA: Diagnosis not present

## 2017-11-01 DIAGNOSIS — F4323 Adjustment disorder with mixed anxiety and depressed mood: Secondary | ICD-10-CM | POA: Diagnosis not present

## 2017-11-07 NOTE — Progress Notes (Addendum)
Cardiology Office Note   Date:  11/08/2017   ID:  Melanie Cook, DOB 12-11-1960, MRN [id number]  PCP:  Burnis Medin, MD  Cardiologist:   Dorris Carnes, MD   F/U of SCAD and lipids     History of Present Illness: Melanie Cook is a 57 y.o. female with a history of spontaneous coronary artery dissection of LAD in 2008  She is s/p PTCA/DES x 3  Also seen by Dr Pollie Meyer at [facility]  Her brother had an aortic dissection and his son has aneurysm of aorta.   Impression from that visit in 2008 was that she has probable connective tissue disorder though not identifiable.  Pt also has a history of HL and rectal CA (s/p resection, chemo, XRT) CT of abdomen in 2015 showed calcificationso of aorta  ? smal dissection flap of R comon iliac  Coronary calcifications noted.    Since seen the pt denies CP  Breathing is OK  She remains very active COmplains of cramping in toes at night   Current Meds  Medication Sig  . Cholecalciferol (D 1000) 1000 units capsule Take 1,000 Units by mouth daily.   . Cyanocobalamin (VITAMIN B 12 PO) Take by mouth daily.     Allergies:   Latex and Prochlorperazine   Past Medical History:  Diagnosis Date  . Allergy    latex  . Cancer ([place]) 12/26/13 bx   rectalinvasive adenocarcinoma  . Coronary artery disease   . Heart disease 2008   w/LAD and 3 stents  . 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 Hypercholesterolemia   . PONV (postoperative nausea and vomiting)     Past Surgical History:  Procedure Laterality Date  . CARDIAC SURGERY  06/2006   stents x 3  . CLOSURE OF ENTEROSTOMY, LARGE OR SMALL INTESTINE; WITH RESECTION AND ANASTOMOSIS OTHER THAN COLORECTAL  11/01/14   rectal cancer  . COLONOSCOPY WITH PROPOFOL N/A 12/26/2013   Procedure: COLONOSCOPY WITH PROPOFOL;  Surgeon: Garlan Fair, MD;  Location: [facility];  Service: Endoscopy;  Laterality: N/A;  . CORONARY ANGIOPLASTY     3 stents  . EUS N/A 01/03/2014   Procedure: LOWER ENDOSCOPIC  ULTRASOUND (EUS);  Surgeon: Arta Silence, MD;  Location: [facility];  Service: Endoscopy;  Laterality: N/A;  . PROCTECTOMY W/COLONIC RESERVOIR, open low anterior resection, coloanal anastomosis, colonic J-pouch, loop ilesotomy, ERAS  05/02/14   rectal cancer     Social History:  The patient  reports that she has never smoked. She has never used smokeless tobacco. She reports that she does not drink alcohol or use drugs.   Family History:  The patient's family history includes Heart disease in her brother, father, and mother; Marfan syndrome in her brother.    ROS:  Please see the history of present illness. All other systems are reviewed and  Negative to the above problem except as noted.    PHYSICAL EXAM: VS:  BP 104/60 (BP Location: Left Arm, Patient Position: Sitting, Cuff Size: Normal)   Pulse (!) 53   Ht 5' 5.25" (1.657 m)   Wt 129 lb 12.8 oz (58.9 kg)   LMP 12/16/2013 Comment: one every 5 months  BMI 21.43 kg/m   GEN: Well nourished, well developed, in no acute distress  HEENT: normal  Neck: JVP is normal  No carotid bruits, or masses Cardiac: RRR; no murmurs, rubs, or gallops,no edema  Respiratory:  clear to auscultation bilaterally, normal work  of breathing GI: soft, nontender, nondistended, + BS  No hepatomegaly  MS: Bunyons bilateral Moving all extremities   Skin: warm and dry, no rash Neuro:  Strength and sensation are intact Psych: euthymic mood, full affect   EKG:  EKG is ordered today.  SB 53 bpm      Lipid Panel    Component Value Date/Time   CHOL 228 (H) 09/04/2016 0856   TRIG 62 09/04/2016 0856   HDL 78 09/04/2016 0856   CHOLHDL 2.9 09/04/2016 0856   CHOLHDL 3 09/05/2015 0859   VLDL 13.8 09/05/2015 0859   LDLCALC 138 (H) 09/04/2016 0856      Wt Readings from Last 3 Encounters:  11/08/17 129 lb 12.8 oz (58.9 kg)  03/24/17 133 lb (60.3 kg)  09/22/16 134 lb 8 oz (61 kg)      ASSESSMENT AND PLAN:  1  Hx of SCAD  Doing well   Has not had  problem since this event    2  Connective tissue dz  Undefined  Pt seen by Dr Pollie Meyer in past  Normal aorta on CT scan at [facility]  I will review with geneticist re any furhter testing    Aorta in 2015 CT was normal in size  3   Lipids  WIll check lipids today   Pthas wanted to work on diet  Has not wanted to use statin   Discussed calcifications   Mild   Will review once results in.   4   Toes   Will check electrolyes  Encouraged her to hydrate   Alos reocmm flexing toes, moving with hads to help    F/U in 1 year  Current medicines are reviewed at length with the patient today.  The patient does not have concerns regarding medicines.  Signed, Dorris Carnes, MD  11/08/2017 8:19 AM    [address], [place], [place]  [address] Phone: [phone]; Fax: [phone]

## 2017-11-08 ENCOUNTER — Encounter: Payer: Self-pay | Admitting: Internal Medicine

## 2017-11-08 ENCOUNTER — Ambulatory Visit: Payer: BLUE CROSS/BLUE SHIELD | Admitting: Internal Medicine

## 2017-11-08 VITALS — BP 104/60 | HR 53 | Ht 65.25 in | Wt 129.8 lb

## 2017-11-08 DIAGNOSIS — M359 Systemic involvement of connective tissue, unspecified: Secondary | ICD-10-CM | POA: Diagnosis not present

## 2017-11-08 DIAGNOSIS — E785 Hyperlipidemia, unspecified: Secondary | ICD-10-CM

## 2017-11-08 DIAGNOSIS — I2542 Coronary artery dissection: Secondary | ICD-10-CM

## 2017-11-08 DIAGNOSIS — F4323 Adjustment disorder with mixed anxiety and depressed mood: Secondary | ICD-10-CM | POA: Diagnosis not present

## 2017-11-08 LAB — BASIC METABOLIC PANEL
BUN/Creatinine Ratio: 18 (ref 9–23)
BUN: 14 mg/dL (ref 6–24)
CO2: 23 mmol/L (ref 20–29)
Calcium: 10.1 mg/dL (ref 8.7–10.2)
Chloride: 103 mmol/L (ref 96–106)
Creatinine, Ser: 0.78 mg/dL (ref 0.57–1.00)
GFR calc Af Amer: 98 mL/min/{1.73_m2} (ref >59)
GFR calc non Af Amer: 85 mL/min/{1.73_m2} (ref >59)
Glucose: 87 mg/dL (ref 65–99)
Potassium: 5 mmol/L (ref 3.5–5.2)
Sodium: 140 mmol/L (ref 134–144)

## 2017-11-08 LAB — LIPID PANEL
Chol/HDL Ratio: 3.4 ratio (ref 0.0–4.4)
Cholesterol, Total: 213 mg/dL — ABNORMAL HIGH (ref 100–199)
HDL: 63 mg/dL (ref >39)
LDL Calculated: 135 mg/dL — ABNORMAL HIGH (ref 0–99)
Triglycerides: 77 mg/dL (ref 0–149)
VLDL Cholesterol Cal: 15 mg/dL (ref 5–40)

## 2017-11-08 LAB — CBC
Hematocrit: 40.4 % (ref 34.0–46.6)
Hemoglobin: 13.7 g/dL (ref 11.1–15.9)
MCH: 31.9 pg (ref 26.6–33.0)
MCHC: 33.9 g/dL (ref 31.5–35.7)
MCV: 94 fL (ref 79–97)
Platelets: 231 10*3/uL (ref 150–450)
RBC: 4.3 x10E6/uL (ref 3.77–5.28)
RDW: 14.2 % (ref 12.3–15.4)
WBC: 7.9 10*3/uL (ref 3.4–10.8)

## 2017-11-08 LAB — MAGNESIUM: Magnesium: 2.3 mg/dL (ref 1.6–2.3)

## 2017-11-08 NOTE — Patient Instructions (Signed)
Medication Instructions:  Your physician recommends that you continue on your current medications as directed. Please refer to the Current Medication list given to you today.  Labwork: Your physician recommends that you return for lab work now: cbc, bmet, magnesium, lipids   Testing/Procedures: none  Follow-Up: Your physician wants you to follow-up in: 1 year with Dr. Harrington Challenger.  You will receive a reminder letter in the mail two months in advance. If you don't receive a letter, please call our office to schedule the follow-up appointment.   Any Other Special Instructions Will Be Listed Below (If Applicable).     If you need a refill on your cardiac medications before your next appointment, please call your pharmacy.

## 2017-11-15 DIAGNOSIS — F4323 Adjustment disorder with mixed anxiety and depressed mood: Secondary | ICD-10-CM | POA: Diagnosis not present

## 2017-11-22 DIAGNOSIS — F4323 Adjustment disorder with mixed anxiety and depressed mood: Secondary | ICD-10-CM | POA: Diagnosis not present

## 2017-11-29 DIAGNOSIS — F4323 Adjustment disorder with mixed anxiety and depressed mood: Secondary | ICD-10-CM | POA: Diagnosis not present

## 2017-12-13 DIAGNOSIS — F4323 Adjustment disorder with mixed anxiety and depressed mood: Secondary | ICD-10-CM | POA: Diagnosis not present

## 2017-12-27 DIAGNOSIS — F4323 Adjustment disorder with mixed anxiety and depressed mood: Secondary | ICD-10-CM | POA: Diagnosis not present

## 2017-12-28 ENCOUNTER — Telehealth: Payer: Self-pay | Admitting: Specialist

## 2017-12-28 NOTE — Telephone Encounter (Signed)
New message  Patient is returning your call in reference to setting up an appt with a Genetics Specialist. Please return call, the patient would like a message left if not available and please give a couple of appointment times and the patient will give you a call back to confirm the appt time.

## 2018-01-03 DIAGNOSIS — F4323 Adjustment disorder with mixed anxiety and depressed mood: Secondary | ICD-10-CM | POA: Diagnosis not present

## 2018-01-03 NOTE — Telephone Encounter (Signed)
Spoke with patient and scheduled her with Dr. Broadus John on Jan 13, 2018 at 1pm. Pt wanted to know if Dr. Broadus John had access to her [facility] doctor Colletta [place] Wechsler) from 2008.  This is accessible in [place].  She also wanted to know if there is going to be any blood work-would she need to be fasting. I adv I would send a message to Dr. Broadus John and if so would call her back.

## 2018-01-13 ENCOUNTER — Telehealth: Payer: Self-pay | Admitting: Internal Medicine

## 2018-01-13 ENCOUNTER — Ambulatory Visit: Payer: BLUE CROSS/BLUE SHIELD | Admitting: Genetic Counselor

## 2018-01-13 ENCOUNTER — Other Ambulatory Visit: Payer: Self-pay | Admitting: *Deleted

## 2018-01-13 DIAGNOSIS — I2542 Coronary artery dissection: Secondary | ICD-10-CM

## 2018-01-13 NOTE — Telephone Encounter (Signed)
Note I received from Dr Broadus John Please place order       Dear Dr. Harrington Challenger,   Thank you for referring your patient, Melanie Cook for genetics (appointment on 01/13/18 @ 1 pm)   Could you please place a genetictest order for her so she can have her blood drawn tomorrow after the visit.   The order can be placed electronically by selecting "[facility]" in your labs test order menu and then selecting "Familial Thoracic Aortic Aneurysm and Dilatation" from the test menu. You can also select "vascular Ehlers Danlos syndrome" as that can also contribute to TAA. There is no need to select Marfan as her OV notes do not indicate Marfans' in the differential diagnosis.   I hope this information helps.   I will forward my notes to you after the visit and will keep you posted on her status.   Regards  [place]

## 2018-01-13 NOTE — Progress Notes (Signed)
See telephone encounter dated today.

## 2018-01-17 DIAGNOSIS — F4323 Adjustment disorder with mixed anxiety and depressed mood: Secondary | ICD-10-CM | POA: Diagnosis not present

## 2018-01-18 NOTE — Progress Notes (Signed)
Referral Reason Melanie Cook has a family history of likely Marfan syndrome and was referred by Dr. Dorris Carnes for a genetic assessment subsequent to a spontaneous coronary artery dissection (SCAD) in 2008.   Pre-Test Genetic Assessment Notes  Her medical and 4-generation family history was obtained. She was counseled on the genetics of Marfan Syndrome (MFS). We also discussed other syndromes that present with aortic aneurysms and dissections, specifically Loeys-Dietz syndrome (LDS), Vascular Ehlers-Danlos Syndrome (EDS, IV) and other non-syndromic familial forms of thoracic aortic aneurysms and dissection (FTAAD). We discussed inheritance, incomplete penetrance and variable expression is seen in patients with these conditions. We walked through the process of genetic testing and discussed the potential outcomes of genetic testing and subsequent management of at-risk family members.  We walked through the process of genetic testing. I explained to her that there are three possible outcomes of genetic testing; namely positive, negative and variant of unknown significance. A positive outcome can be expected in patients that do not have risk factors for aortic disease, present early in life with increased severity and have a family history of sudden cardiac death and/or a relative that has been diagnosed with an aortopathy. A negative test does not exclude a genetic basis for HCM. Limitations in current genetic testing methodology can produce a negative result. Variants of unknown significance (VUS) can be obtained. I explained to her that typically a VUS is so classified if the variant is not well understood as very few individuals have been reported to harbor this variant or its role in gene function has not been elucidated. The potential outcomes of genetic testing and subsequent management of at-risk family members were discussed so as to manage expectations. Her medical and 4-generation family history was  obtained. See details below-  Melanie Cook (III.5 on pedigree) is a 57 year old pleasant Caucasian woman who works in Nurse, learning disability at [employer]. She states that she had no prior symptoms before her dissection in 2008. She tells me that she was playing tennis and suddenly felt nauseous with numbness in her arms and shoulders began to hurt. She went to the ER, had a heart cath and was found to have dissection of the left anterior descending artery.  She was referred to Dr. Shane Crutch at [facility] for evaluation of Marfan syndrome and was found to not have a clinical diagnosis of Marfan syndrome (MFS). Genetic testing was pursued at that time for the FBN1 gene for MFS and TGFBR2 for Melanie Cook syndrome (LDS). No pathogenic variants were detected in these two genes, based upon review of Dr. Teressa Lower notes from [facility].  Family history Melanie Cook (III.5) is one of six siblings. She has two sisters (III.1, III.4) and two living brothers (III.2, III.6), who are currently in good health. She informs me that her brother  (III.3) had a dissected aorta at 28. He underwent aortic valve repair at [facility]. She tells me that he had "classic Azalia Bilis physique" and also had pectus excavatum. He was eventually diagnosed with MFS She believes that he underwent genetic testing for MFS and was found to have a mutation in the FBN1 gene (to be verified, called and left message 01/18/18). She states that he had a brain bleed and clotting in August this year. He was in induced coma and died from an infection soon after.  He has three sons, ages 50, 61 and 51 (IV.13-IV.15). The middle son (IV.14) has clinical presentation of MFS and believes that he was also  found to have the familial pathogenic variant in FBN1 (to be verified). The other two sons (IV.13, IV.15) were genotype negative.    She states that after her brother was diagnosed with MFS, all the siblings were evaluated for  MFS and were found to be negative for this condition. Melanie Cook was evaluated for MFS at [facility] by Dr. Shane Crutch.   There is no history of sudden death or aortic dissection in her paternal lineage. Her father (II.5) lived to 23. She states that all his other siblings (II.1-II.3), except one died of old age. One of his brothers (II.4) died of a heart attack at 24. Her paternal grandfather (I.1) died in a train accident and his wife (I.2) lived to 70.   Melanie Cook reports sudden death in her mother (II.6). She tells me that her mother was brushing her teeth one night, when she fell back, collapsed and died. They assumed that she died of a heart attack. However, after Melanie Cook's brother was diagnosed with MFS, they suspect that she may have an aortic dissection. An autopsy was not performed to confirm her cause of death. She reports a tall habitus in one of her maternal aunts (II.7, Melanie Cook) and her maternal grandfather (I.3) and is not sure if her maternal aunt was evaluated for MFS. She also reports an abnormal heart rhythm in her maternal uncle (II.9)  Impression  In summary, Melanie Cook presented with an acute SCAD with no prior symptoms and has a significant family history of sudden death (mother) and two other family members (brother and nephew) with a likely diagnosis of Marfan syndrome (to be verified). It is highly likely that she may have a genetic condition that she may have inherited from her mother.   Melanie Cook is interested in genetic testing, especially for those genes that have been found to be associated with aortopathies since her negative genetic test of 2008. Only a minority of patients with SCAD present with a syndromic connective tissue disorder, like vascular Ehlers-Danlos (cEDS), Marfan syndrome (MFS) or Loeys-Dietz syndrome (LDS). It has also been associated with polycystic kidney diseae, Alport syndrome, takotsubo syndrome and other conditions. Recent genetic testing have identified pathogenic variants in the  COL3A1 gene (for vascular EDS) in addition to FBN1 gene (Melanie Cook et al., 2016, [place], 2018)    In light of her disease presentation and the presence of other family members with a likely diagnosis of Marfan, and sudden death, I would recommend genetic testing for the aortopathy gene panel. This gene panel includes the major genes that contribute to LDS, vEDS and FTAAD. The genetic test will help confirm her diagnosis and identify the genetic basis of her disease. It will also help identify first-degree family members (children, sibling) that may harbor the mutation and are at risk of developing this condition. Appropriate cardiology follow-up and lifestyle management can then be directed to those genotype-positive family members.    In addition, we also discussed the protections afforded by the Genetic Information Non-Discrimination Act (GINA). I explained to her that GINA protects her from losing employment or health insurance based on her genotype. However, these protections do not cover life insurance and disability. I recommended that she obtain appropriate life insurance coverage for her children to avoid future denials, if they test positive for the familial variant. She verbalized understanding of this  Ivey is interested in genetic testing. Blood was drawn today and sent out for testing.      Lattie Corns, Ph.D, [facility] Clinical Molecular Geneticist

## 2018-02-07 DIAGNOSIS — F4323 Adjustment disorder with mixed anxiety and depressed mood: Secondary | ICD-10-CM | POA: Diagnosis not present

## 2018-02-08 DIAGNOSIS — C20 Malignant neoplasm of rectum: Secondary | ICD-10-CM | POA: Diagnosis not present

## 2018-02-21 DIAGNOSIS — L814 Other melanin hyperpigmentation: Secondary | ICD-10-CM | POA: Diagnosis not present

## 2018-02-21 DIAGNOSIS — D2261 Melanocytic nevi of right upper limb, including shoulder: Secondary | ICD-10-CM | POA: Diagnosis not present

## 2018-02-21 DIAGNOSIS — L821 Other seborrheic keratosis: Secondary | ICD-10-CM | POA: Diagnosis not present

## 2018-02-21 DIAGNOSIS — D225 Melanocytic nevi of trunk: Secondary | ICD-10-CM | POA: Diagnosis not present

## 2018-03-07 DIAGNOSIS — F4323 Adjustment disorder with mixed anxiety and depressed mood: Secondary | ICD-10-CM | POA: Diagnosis not present

## 2018-03-18 ENCOUNTER — Other Ambulatory Visit: Payer: Self-pay | Admitting: Certified Nurse Midwife

## 2018-03-18 DIAGNOSIS — Z1231 Encounter for screening mammogram for malignant neoplasm of breast: Secondary | ICD-10-CM

## 2018-04-04 DIAGNOSIS — Z980 Intestinal bypass and anastomosis status: Secondary | ICD-10-CM | POA: Diagnosis not present

## 2018-04-04 DIAGNOSIS — Z1211 Encounter for screening for malignant neoplasm of colon: Secondary | ICD-10-CM | POA: Diagnosis not present

## 2018-04-04 DIAGNOSIS — D122 Benign neoplasm of ascending colon: Secondary | ICD-10-CM | POA: Diagnosis not present

## 2018-04-04 DIAGNOSIS — Z85048 Personal history of other malignant neoplasm of rectum, rectosigmoid junction, and anus: Secondary | ICD-10-CM | POA: Diagnosis not present

## 2018-04-04 DIAGNOSIS — Z79899 Other long term (current) drug therapy: Secondary | ICD-10-CM | POA: Diagnosis not present

## 2018-04-04 DIAGNOSIS — K635 Polyp of colon: Secondary | ICD-10-CM | POA: Diagnosis not present

## 2018-04-04 DIAGNOSIS — Z85038 Personal history of other malignant neoplasm of large intestine: Secondary | ICD-10-CM | POA: Diagnosis not present

## 2018-04-07 ENCOUNTER — Other Ambulatory Visit: Payer: Self-pay

## 2018-04-07 ENCOUNTER — Ambulatory Visit: Payer: BLUE CROSS/BLUE SHIELD | Admitting: Certified Nurse Midwife

## 2018-04-07 ENCOUNTER — Encounter: Payer: Self-pay | Admitting: Certified Nurse Midwife

## 2018-04-07 ENCOUNTER — Other Ambulatory Visit (HOSPITAL_COMMUNITY)
Admission: RE | Admit: 2018-04-07 | Discharge: 2018-04-07 | Disposition: A | Discharge status: Home / Self Care | Payer: BLUE CROSS/BLUE SHIELD | Source: Ambulatory Visit | Attending: Certified Nurse Midwife | Admitting: Certified Nurse Midwife

## 2018-04-07 VITALS — BP 110/68 | HR 64 | Resp 16 | Ht 65.25 in | Wt 131.0 lb

## 2018-04-07 DIAGNOSIS — N6310 Unspecified lump in the right breast, unspecified quadrant: Secondary | ICD-10-CM | POA: Diagnosis not present

## 2018-04-07 DIAGNOSIS — N8110 Cystocele, unspecified: Secondary | ICD-10-CM

## 2018-04-07 DIAGNOSIS — N951 Menopausal and female climacteric states: Secondary | ICD-10-CM | POA: Diagnosis not present

## 2018-04-07 DIAGNOSIS — Z124 Encounter for screening for malignant neoplasm of cervix: Secondary | ICD-10-CM | POA: Diagnosis not present

## 2018-04-07 DIAGNOSIS — Z01411 Encounter for gynecological examination (general) (routine) with abnormal findings: Secondary | ICD-10-CM

## 2018-04-07 DIAGNOSIS — N393 Stress incontinence (female) (male): Secondary | ICD-10-CM

## 2018-04-07 DIAGNOSIS — Z85038 Personal history of other malignant neoplasm of large intestine: Secondary | ICD-10-CM

## 2018-04-07 NOTE — Progress Notes (Signed)
58 y.o. G25P2002 Married  Caucasian Fe here for annual exam. Menopausal no HRT. Denies vaginal bleeding or vaginal dryness. Had recent colonoscopy with 2 polyps removed, no results yet. Still having incontinence with playing tennis, cough, vigorous exercise. Had information given at last visit to see PT with Ileana Roup, but did not go, considering now. Sees Cardiology yearly for labs and cholesterol management, all stable. No recent HSV infections. No other health issues today.  Patient's last menstrual period was 12/16/2013.          Sexually active: Yes.    The current method of family planning is vasectomy.    Exercising: Yes.    tennis & walking Smoker:  no  Review of Systems  Constitutional: Negative.   HENT: Negative.   Eyes: Negative.   Respiratory: Negative.   Cardiovascular: Negative.   Gastrointestinal: Negative.   Genitourinary: Negative.   Musculoskeletal: Negative.   Skin: Negative.   Neurological: Negative.   Endo/Heme/Allergies: Negative.   Psychiatric/Behavioral: Negative.     Health Maintenance: Pap:  03-24-16 neg, 03-24-17 neg History of Abnormal Pap: no MMG:  04-19-17 category c density birads 1:neg Self Breast exams: yes Colonoscopy:  2019 polyps, rectal cancer 2016 BMD:   2017 TDaP:  2016 Shingles: had done Pneumonia: no Hep C and HIV: both neg 2016 Labs: if needed   reports that she has never smoked. She has never used smokeless tobacco. She reports that she does not drink alcohol or use drugs.  Past Medical History:  Diagnosis Date  . Allergy    latex  . Cancer ([place]) 12/26/13 bx   rectalinvasive adenocarcinoma  . Coronary artery disease   . Heart disease 2008   w/LAD and 3 stents  . 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 Hypercholesterolemia   . PONV (postoperative nausea and vomiting)     Past Surgical History:  Procedure Laterality Date  . CARDIAC SURGERY  06/2006   stents x 3  . CLOSURE OF ENTEROSTOMY, LARGE OR SMALL  INTESTINE; WITH RESECTION AND ANASTOMOSIS OTHER THAN COLORECTAL  11/01/14   rectal cancer  . COLONOSCOPY WITH PROPOFOL N/A 12/26/2013   Procedure: COLONOSCOPY WITH PROPOFOL;  Surgeon: Garlan Fair, MD;  Location: [facility];  Service: Endoscopy;  Laterality: N/A;  . CORONARY ANGIOPLASTY     3 stents  . EUS N/A 01/03/2014   Procedure: LOWER ENDOSCOPIC ULTRASOUND (EUS);  Surgeon: Arta Silence, MD;  Location: [facility];  Service: Endoscopy;  Laterality: N/A;  . PROCTECTOMY W/COLONIC RESERVOIR, open low anterior resection, coloanal anastomosis, colonic J-pouch, loop ilesotomy, ERAS  05/02/14   rectal cancer    Current Outpatient Medications  Medication Sig Dispense Refill  . Cholecalciferol (D 1000) 1000 units capsule Take 1,000 Units by mouth daily.     . Cyanocobalamin (VITAMIN B 12 PO) Take by mouth daily.     No current facility-administered medications for this visit.     Family History  Problem Relation Age of Onset  . Heart disease Mother   . Heart disease Father        Psychologist, forensic  . Heart disease Brother   . Marfan syndrome Brother        age 43 dissection    ROS:  Pertinent items are noted in HPI.  Otherwise, a comprehensive ROS was negative.  Exam:   LMP 12/16/2013 Comment: one every 5 months   Ht Readings from Last 3 Encounters:  11/08/17 5' 5.25" (1.657 m)  03/24/17 5' 5.25" (1.657 m)  09/22/16 5\' 6"  (1.676 m)    General appearance: alert, cooperative and appears stated age Head: Normocephalic, without obvious abnormality, atraumatic Neck: no adenopathy, supple, symmetrical, trachea midline and thyroid normal to inspection and palpation Lungs: clear to auscultation bilaterally Breasts: normal appearance, no masses or tenderness, No nipple retraction or dimpling, No nipple discharge or bleeding, No axillary or supraclavicular adenopathy, right breast mass at 1 o'clock 3 fb from outer edge 1 cm, ? lymph node vs mass Heart: regular rate and rhythm Abdomen:  soft, non-tender; no masses,  no organomegaly Extremities: extremities normal, atraumatic, no cyanosis or edema Skin: Skin color, texture, turgor normal. No rashes or lesions Lymph nodes: Cervical, supraclavicular, and axillary nodes normal. No abnormal inguinal nodes palpated Neurologic: Grossly normal Breasts: Physical Exam Chest:          Pelvic: External genitalia:  no lesions, normal female              Urethra:  normal appearing urethra with no masses, tenderness or lesions              Bartholin's and Skene's: normal                 Vagina: normal appearing vagina with normal color and discharge, no lesions              Cervix: no cervical motion tenderness and no lesions              Pap taken: Yes.   Bimanual Exam:  Uterus:  normal size, contour, position, consistency, mobility, non-tender and anteverted              Adnexa: normal adnexa and no mass, fullness, tenderness               Rectovaginal: Confirms               Anus:  normal sphincter tone, no lesions ( has J pouch from rectal invasive cancer)  Chaperone present: yes  A:  Well Woman with normal exam   Menopausal with occasional vaginal dryness  Cystocele grade 3 symptomatic  Right breast mass vs lymph node  History of invasive rectal adenocarcinoma, recent colonoscopy with polyps noted, no results yet  History of HSV no Rx needed  P:   Reviewed health and wellness pertinent to exam  Discussed OTC Olive oil or coconut oil use with instructions given.  Discussed feel cystocele has increased and would advise PT evaluation and possible evaluation with UroGyn for evaluation for repair. She may have done at [facility] due to previous surgery.  Discussed breast finding and need for evaluation. Will be scheduled for diagnostic mammogram and [place] prior to leaving.  Follow up with GI as indicated  Pap smear: yes   counseled on breast self exam, mammography screening, feminine hygiene, adequate intake of calcium and vitamin  D, diet and exercise  return annually or prn  An After Visit Summary was printed and given to the patient.

## 2018-04-07 NOTE — Progress Notes (Signed)
Patient is scheduled for R Breast Diagnostic Mammogram and R Breast Ultrasound at [facility] on 04/12/2018 at 1320 . Patient agreeable to time/date/location.

## 2018-04-07 NOTE — Patient Instructions (Signed)

## 2018-04-08 LAB — CYTOLOGY - PAP
Diagnosis: NEGATIVE
HPV: NOT DETECTED

## 2018-04-08 LAB — HM COLONOSCOPY

## 2018-04-12 ENCOUNTER — Ambulatory Visit
Admission: RE | Admit: 2018-04-12 | Discharge: 2018-04-12 | Disposition: A | Discharge status: Home / Self Care | Payer: BLUE CROSS/BLUE SHIELD | Source: Ambulatory Visit | Attending: Certified Nurse Midwife | Admitting: Certified Nurse Midwife

## 2018-04-12 DIAGNOSIS — N6489 Other specified disorders of breast: Secondary | ICD-10-CM | POA: Diagnosis not present

## 2018-04-12 DIAGNOSIS — N6310 Unspecified lump in the right breast, unspecified quadrant: Secondary | ICD-10-CM

## 2018-04-12 DIAGNOSIS — R922 Inconclusive mammogram: Secondary | ICD-10-CM | POA: Diagnosis not present

## 2018-04-13 ENCOUNTER — Telehealth: Payer: Self-pay | Admitting: Certified Nurse Midwife

## 2018-04-13 NOTE — Telephone Encounter (Signed)
Return call to patient.  Detailed message left with message from Melvia Heaps CNM  Has screening mammogram 04/26/18 scheduled.   Call back with any questions.

## 2018-04-13 NOTE — Telephone Encounter (Signed)
-----   Message from Regina Eck, CNM sent at 04/13/2018  7:47 AM EST ----- Notify patient that suspected may have been fibroglandular tissue, she has routine screening scheduled this month which she needs to keep that appointment for comparison. Will review at that time to see if any further changes Mammogram hold for routine screen results this month

## 2018-04-13 NOTE — Telephone Encounter (Signed)
Patient called and said her recent sonohysterography was negative. She has some questions about how to proceed from here.

## 2018-04-18 ENCOUNTER — Encounter: Payer: Self-pay | Admitting: Internal Medicine

## 2018-04-18 DIAGNOSIS — F4323 Adjustment disorder with mixed anxiety and depressed mood: Secondary | ICD-10-CM | POA: Diagnosis not present

## 2018-04-26 ENCOUNTER — Ambulatory Visit
Admission: RE | Admit: 2018-04-26 | Discharge: 2018-04-26 | Disposition: A | Discharge status: Home / Self Care | Payer: BLUE CROSS/BLUE SHIELD | Source: Ambulatory Visit | Attending: Certified Nurse Midwife | Admitting: Certified Nurse Midwife

## 2018-04-26 DIAGNOSIS — Z1231 Encounter for screening mammogram for malignant neoplasm of breast: Secondary | ICD-10-CM | POA: Diagnosis not present

## 2018-05-30 DIAGNOSIS — F4323 Adjustment disorder with mixed anxiety and depressed mood: Secondary | ICD-10-CM | POA: Diagnosis not present

## 2018-08-25 ENCOUNTER — Encounter: Payer: Self-pay | Admitting: Internal Medicine

## 2018-10-20 DIAGNOSIS — D224 Melanocytic nevi of scalp and neck: Secondary | ICD-10-CM | POA: Diagnosis not present

## 2018-10-20 DIAGNOSIS — L821 Other seborrheic keratosis: Secondary | ICD-10-CM | POA: Diagnosis not present

## 2018-11-22 DIAGNOSIS — Z9049 Acquired absence of other specified parts of digestive tract: Secondary | ICD-10-CM | POA: Diagnosis not present

## 2018-11-22 DIAGNOSIS — C20 Malignant neoplasm of rectum: Secondary | ICD-10-CM | POA: Diagnosis not present

## 2019-01-23 ENCOUNTER — Other Ambulatory Visit: Payer: Self-pay

## 2019-01-23 DIAGNOSIS — Z20822 Contact with and (suspected) exposure to covid-19: Secondary | ICD-10-CM

## 2019-01-25 LAB — NOVEL CORONAVIRUS, NAA: SARS-CoV-2, NAA: NOT DETECTED

## 2019-02-14 DIAGNOSIS — C20 Malignant neoplasm of rectum: Secondary | ICD-10-CM | POA: Diagnosis not present

## 2019-03-09 DIAGNOSIS — D225 Melanocytic nevi of trunk: Secondary | ICD-10-CM | POA: Diagnosis not present

## 2019-03-09 DIAGNOSIS — L814 Other melanin hyperpigmentation: Secondary | ICD-10-CM | POA: Diagnosis not present

## 2019-03-09 DIAGNOSIS — L820 Inflamed seborrheic keratosis: Secondary | ICD-10-CM | POA: Diagnosis not present

## 2019-03-09 DIAGNOSIS — D2261 Melanocytic nevi of right upper limb, including shoulder: Secondary | ICD-10-CM | POA: Diagnosis not present

## 2019-03-09 DIAGNOSIS — L821 Other seborrheic keratosis: Secondary | ICD-10-CM | POA: Diagnosis not present

## 2019-03-13 ENCOUNTER — Other Ambulatory Visit: Payer: Self-pay | Admitting: Certified Nurse Midwife

## 2019-03-13 DIAGNOSIS — Z1231 Encounter for screening mammogram for malignant neoplasm of breast: Secondary | ICD-10-CM

## 2019-03-20 ENCOUNTER — Telehealth: Payer: Self-pay | Admitting: Internal Medicine

## 2019-03-20 NOTE — Telephone Encounter (Signed)
SPoke to patient  Yearly follow up Please place at 8 AM Friday 12/18

## 2019-03-21 NOTE — Telephone Encounter (Signed)
Pt scheduled Friday 8:20am  8:00am slot is filled.

## 2019-03-23 NOTE — Progress Notes (Signed)
Cardiology Office Note   Date:  03/24/2019   ID:  Melanie Cook, DOB February 22, 1961, MRN [id number]  PCP:  Burnis Medin, MD  Cardiologist:   Dorris Carnes, MD   F/U of lipids      History of Present Illness: Melanie Cook is a 58 y.o. female with a history of spontaneous coronary artery dissection of LAD in 2008  She is s/p PTCA/DES x 3  Also seen by Dr Pollie Meyer at [facility] in the past Her brother had an aortic dissection and his son has aneurysm of the aorta.  .  Pt also has a history of HL and rectal caner ( she has completed surgery, chemotherapy and XRT.  Follows at [facility]. Now almost 5 years out )CT scan of abdomen in past showed calcification of aorta.(small)  Coronary calcifications alson noted     I saw the pt in Fall 2019  LDL 135   HDL 63 at that time    Since seen in clinic she has done well from a cardiac standpoint   Breathing is OK   Denies CP   Very active     Current Meds  Medication Sig  . ascorbic acid (VITAMIN C) 1000 MG tablet Take by mouth.  . Cholecalciferol (D 1000) 1000 units capsule Take 1,000 Units by mouth.   . Cyanocobalamin (VITAMIN B 12 PO) Take by mouth.   . Zinc Citrate-Phytase (ZYTAZE) 25-500 MG CAPS Take by mouth.     Allergies:   Latex and Prochlorperazine   Past Medical History:  Diagnosis Date  . Allergy    latex  . Cancer ([place]) 12/26/13 bx   rectalinvasive adenocarcinoma  . Coronary artery disease   . Heart disease 2008   w/LAD and 3 stents  . 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 Hypercholesterolemia   . PONV (postoperative nausea and vomiting)     Past Surgical History:  Procedure Laterality Date  . CARDIAC SURGERY  06/2006   stents x 3  . CLOSURE OF ENTEROSTOMY, LARGE OR SMALL INTESTINE; WITH RESECTION AND ANASTOMOSIS OTHER THAN COLORECTAL  11/01/14   rectal cancer  . COLONOSCOPY WITH PROPOFOL N/A 12/26/2013   Procedure: COLONOSCOPY WITH PROPOFOL;  Surgeon: Garlan Fair, MD;  Location: [facility];  Service:  Endoscopy;  Laterality: N/A;  . CORONARY ANGIOPLASTY     3 stents  . EUS N/A 01/03/2014   Procedure: LOWER ENDOSCOPIC ULTRASOUND (EUS);  Surgeon: Arta Silence, MD;  Location: [facility];  Service: Endoscopy;  Laterality: N/A;  . PROCTECTOMY W/COLONIC RESERVOIR, open low anterior resection, coloanal anastomosis, colonic J-pouch, loop ilesotomy, ERAS  05/02/14   rectal cancer     Social History:  The patient  reports that she has never smoked. She has never used smokeless tobacco. She reports that she does not drink alcohol or use drugs.   Family History:  The patient's family history includes Heart disease in her brother, father, and mother; Marfan syndrome in her brother.    ROS:  Please see the history of present illness. All other systems are reviewed and  Negative to the above problem except as noted.    PHYSICAL EXAM: VS:  BP 108/60   Pulse (!) 56   Ht 5' 5.5" (1.664 m)   Wt 136 lb (61.7 kg)   LMP 12/16/2013 Comment: one every 5 months  BMI 22.29 kg/m   GEN: Well nourished, well developed, in no acute distress  HEENT: normal  Neck: JVP is normal  No carotid bruits Cardiac: RRR; no murmurs, rubs, or gallops,no edema  Respiratory:  clear to auscultation bilaterally, normal work of breathing GI: soft, nontender, nondistended No hepatomegaly  MS: NO obvious abnomalities     Skin: warm and dry Neuro: Grossly intact   Psych: euthymic mood, full affect   EKG:  EKG is ordered today.  SB 56 bpm      Lipid Panel    Component Value Date/Time   CHOL 213 (H) 11/08/2017 0847   TRIG 77 11/08/2017 0847   HDL 63 11/08/2017 0847   CHOLHDL 3.4 11/08/2017 0847   CHOLHDL 3 09/05/2015 0859   VLDL 13.8 09/05/2015 0859   LDLCALC 135 (H) 11/08/2017 0847      Wt Readings from Last 3 Encounters:  03/24/19 136 lb (61.7 kg)  04/07/18 131 lb (59.4 kg)  11/08/17 129 lb 12.8 oz (58.9 kg)      ASSESSMENT AND PLAN:  1  Hx of SCAD  Doing well   Has not had problem since this event     2    Lipids  Pt is due to have lipids soon   Will forward here.   Meanwhile, I will review CT scans from [facility]    Stay active  Watch fats in diet     F/U in 1 year  Current medicines are reviewed at length with the patient today.  The patient does not have concerns regarding medicines.  Signed, Dorris Carnes, MD  03/24/2019 8:38 AM    [address], [place], [place]  [id number] Phone: [phone]; Fax: [phone]

## 2019-03-24 ENCOUNTER — Ambulatory Visit: Payer: BC Managed Care – PPO | Admitting: Internal Medicine

## 2019-03-24 ENCOUNTER — Other Ambulatory Visit: Payer: Self-pay

## 2019-03-24 ENCOUNTER — Encounter: Payer: Self-pay | Admitting: Internal Medicine

## 2019-03-24 VITALS — BP 108/60 | HR 56 | Ht 65.5 in | Wt 136.0 lb

## 2019-03-24 DIAGNOSIS — E785 Hyperlipidemia, unspecified: Secondary | ICD-10-CM | POA: Diagnosis not present

## 2019-03-24 NOTE — Patient Instructions (Addendum)
Medication Instructions:   *If you need a refill on your cardiac medications before your next appointment, please call your pharmacy*  Lab Work:  If you have labs (blood work) drawn today and your tests are completely normal, you will receive your results only by: Marland Kitchen MyChart Message (if you have MyChart) OR . A paper copy in the mail If you have any lab test that is abnormal or we need to change your treatment, we will call you to review the results.  Testing/Procedures: None ordered today.  Follow-Up: At [facility], you and your health needs are our priority.  As part of our continuing mission to provide you with exceptional heart care, we have created designated Provider Care Teams.  These Care Teams include your primary Cardiologist (physician) and Advanced Practice Providers (APPs -  Physician Assistants and Nurse Practitioners) who all work together to provide you with the care you need, when you need it.  Your next appointment:   12 month(s)  The format for your next appointment:   In Person  Provider:   You may see Dorris Carnes, MD or one of the following Advanced Practice Providers on your designated Care Team:    Richardson Dopp, PA-C  [place], [place]  Daune Perch, [place]

## 2019-04-14 ENCOUNTER — Ambulatory Visit: Payer: Self-pay | Admitting: Certified Nurse Midwife

## 2019-05-01 ENCOUNTER — Other Ambulatory Visit: Payer: Self-pay

## 2019-05-01 NOTE — Progress Notes (Signed)
59 y.o. G60P2002 Married  Caucasian Fe here for annual exam. Post menopausal symptoms have stopped. Denies vaginal bleeding or vaginal dryness.Continues with stress incontinence, referred to [facility], decided against going. Feels she would just like to have surgery for repair if needed. Screening lab lipid only. Sees Cardiology at [facility] yearly, all stable. Continues follow up for rectal cancer, all stable. No recent HSV out breaks. No health issues today.  Patient's last menstrual period was 12/16/2013.          Sexually active: Yes.    The current method of family planning is vasectomy.    Exercising: Yes.    tennis & pickle ball Smoker:  no  Review of Systems  Constitutional: Negative.   HENT: Negative.   Eyes: Negative.   Respiratory: Negative.   Cardiovascular: Negative.   Gastrointestinal: Negative.   Genitourinary: Negative.   Musculoskeletal: Negative.   Skin: Negative.   Neurological: Negative.   Endo/Heme/Allergies: Negative.   Psychiatric/Behavioral: Negative.     Health Maintenance: Pap:  03-24-17 neg, 04-07-2018 neg HPV HR neg History of Abnormal Pap: no MMG:  04-26-2018 category d density birads 1:neg Self Breast exams: yes Colonoscopy:  2019 polyps, rectal cancer 2016 BMD:   2017 TDaP:  2020 Shingles: 2019 Pneumonia: not done Hep C and HIV: both neg 2016 Labs: yes   reports that she has never smoked. She has never used smokeless tobacco. She reports that she does not drink alcohol or use drugs.  Past Medical History:  Diagnosis Date  . Allergy    latex  . Cancer ([place]) 12/26/13 bx   rectalinvasive adenocarcinoma  . Coronary artery disease   . Heart disease 2008   w/LAD and 3 stents  . 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 Hypercholesterolemia   . PONV (postoperative nausea and vomiting)     Past Surgical History:  Procedure Laterality Date  . CARDIAC SURGERY  06/2006   stents x 3  . CLOSURE OF ENTEROSTOMY, LARGE OR SMALL  INTESTINE; WITH RESECTION AND ANASTOMOSIS OTHER THAN COLORECTAL  11/01/14   rectal cancer  . COLONOSCOPY WITH PROPOFOL N/A 12/26/2013   Procedure: COLONOSCOPY WITH PROPOFOL;  Surgeon: Garlan Fair, MD;  Location: [facility];  Service: Endoscopy;  Laterality: N/A;  . CORONARY ANGIOPLASTY     3 stents  . EUS N/A 01/03/2014   Procedure: LOWER ENDOSCOPIC ULTRASOUND (EUS);  Surgeon: Arta Silence, MD;  Location: [facility];  Service: Endoscopy;  Laterality: N/A;  . PROCTECTOMY W/COLONIC RESERVOIR, open low anterior resection, coloanal anastomosis, colonic J-pouch, loop ilesotomy, ERAS  05/02/14   rectal cancer    Current Outpatient Medications  Medication Sig Dispense Refill  . ascorbic acid (VITAMIN C) 1000 MG tablet Take by mouth.    . Cholecalciferol (D 1000) 1000 units capsule Take 1,000 Units by mouth.     . Cyanocobalamin (VITAMIN B 12 PO) Take by mouth.      No current facility-administered medications for this visit.    Family History  Problem Relation Age of Onset  . Heart disease Mother   . Heart disease Father        Psychologist, forensic  . Heart disease Brother   . Marfan syndrome Brother        age 81 dissection    ROS:  Pertinent items are noted in HPI.  Otherwise, a comprehensive ROS was negative.  Exam:   BP 102/62   Pulse 68   Temp 98.1 F (  36.7 C) (Skin)   Resp 16   Ht 5' 5.25" (1.657 m)   Wt 135 lb (61.2 kg)   LMP 12/16/2013 Comment: one every 5 months  BMI 22.29 kg/m  Height: 5' 5.25" (165.7 cm) Ht Readings from Last 3 Encounters:  05/02/19 5' 5.25" (1.657 m)  03/24/19 5' 5.5" (1.664 m)  04/07/18 5' 5.25" (1.657 m)    General appearance: alert, cooperative and appears stated age Head: Normocephalic, without obvious abnormality, atraumatic Neck: no adenopathy, supple, symmetrical, trachea midline and thyroid normal to inspection and palpation Lungs: clear to auscultation bilaterally Breasts: normal appearance, no masses or tenderness, No nipple  retraction or dimpling, No nipple discharge or bleeding, No axillary or supraclavicular adenopathy Heart: regular rate and rhythm Abdomen: soft, non-tender; no masses,  no organomegaly Extremities: extremities normal, atraumatic, no cyanosis or edema Skin: Skin color, texture, turgor normal. No rashes or lesions Lymph nodes: Cervical, supraclavicular, and axillary nodes normal. No abnormal inguinal nodes palpated Neurologic: Grossly normal   Pelvic: External genitalia:  no lesions              Urethra  normal appearing urethra with no masses, tenderness or lesions, prolapse noted              Bartholin's and Skene's: normal                 Vagina: normal appearing vagina with normal color and discharge, no lesions, poor tone, mild cystocele noted              Cervix: no cervical motion tenderness, no lesions and normal appearance              Pap taken: Yes.   Bimanual Exam:  Uterus:  normal size, contour, position, consistency, mobility, non-tender and anteverted              Adnexa: normal adnexa and no mass, fullness, tenderness               Rectovaginal: Confirms               Anus:  normal sphincter tone, no lesions  Chaperone present: yes  A:  Well Woman with normal exam  Menopausal no HRT, symptoms resolved  History of rectal cancer, continues follow up  Stress incontinence continues, no change  Cardiology evaluation yearly, all normal  Mammogram due, has scheduled  P:   Reviewed health and wellness pertinent to exam  Aware of need to advise if vaginal bleeding.  Discussed making appointment with Dr. Quincy Simmonds for evaluation and possible treatment. Patient will call when she is ready.  Continue follow up with MD as indicated.  Lab: Lipid panel  Pap smear: yes   counseled on breast self exam, mammography screening, feminine hygiene, menopause, adequate intake of calcium and vitamin D, diet and exercise  return annually or prn  An After Visit Summary was printed and given  to the patient.

## 2019-05-02 ENCOUNTER — Ambulatory Visit: Payer: BC Managed Care – PPO | Admitting: Certified Nurse Midwife

## 2019-05-02 ENCOUNTER — Other Ambulatory Visit (HOSPITAL_COMMUNITY)
Admission: RE | Admit: 2019-05-02 | Discharge: 2019-05-02 | Disposition: A | Discharge status: Home / Self Care | Payer: BC Managed Care – PPO | Source: Ambulatory Visit | Attending: Obstetrics & Gynecology | Admitting: Obstetrics & Gynecology

## 2019-05-02 ENCOUNTER — Encounter: Payer: Self-pay | Admitting: Certified Nurse Midwife

## 2019-05-02 VITALS — BP 102/62 | HR 68 | Temp 98.1°F | Resp 16 | Ht 65.25 in | Wt 135.0 lb

## 2019-05-02 DIAGNOSIS — Z01419 Encounter for gynecological examination (general) (routine) without abnormal findings: Secondary | ICD-10-CM

## 2019-05-02 DIAGNOSIS — Z0000 Encounter for general adult medical examination without abnormal findings: Secondary | ICD-10-CM | POA: Diagnosis not present

## 2019-05-02 DIAGNOSIS — Z124 Encounter for screening for malignant neoplasm of cervix: Secondary | ICD-10-CM | POA: Insufficient documentation

## 2019-05-02 DIAGNOSIS — N393 Stress incontinence (female) (male): Secondary | ICD-10-CM | POA: Diagnosis not present

## 2019-05-02 NOTE — Patient Instructions (Signed)
EXERCISE AND DIET:  We recommended that you start or continue a regular exercise program for good health. Regular exercise means any activity that makes your heart beat faster and makes you sweat.  We recommend exercising at least 30 minutes per day at least 3 days a week, preferably 4 or 5.  We also recommend a diet low in fat and sugar.  Inactivity, poor dietary choices and obesity can cause diabetes, heart attack, stroke, and kidney damage, among others.    ALCOHOL AND SMOKING:  Women should limit their alcohol intake to no more than 7 drinks/beers/glasses of wine (combined, not each!) per week. Moderation of alcohol intake to this level decreases your risk of breast cancer and liver damage. And of course, no recreational drugs are part of a healthy lifestyle.  And absolutely no smoking or even second hand smoke. Most people know smoking can cause heart and lung diseases, but did you know it also contributes to weakening of your bones? Aging of your skin?  Yellowing of your teeth and nails?  CALCIUM AND VITAMIN D:  Adequate intake of calcium and Vitamin D are recommended.  The recommendations for exact amounts of these supplements seem to change often, but generally speaking 600 mg of calcium (either carbonate or citrate) and 800 units of Vitamin D per day seems prudent. Certain women may benefit from higher intake of Vitamin D.  If you are among these women, your doctor will have told you during your visit.    PAP SMEARS:  Pap smears, to check for cervical cancer or precancers,  have traditionally been done yearly, although recent scientific advances have shown that most women can have pap smears less often.  However, every woman still should have a physical exam from her gynecologist every year. It will include a breast check, inspection of the vulva and vagina to check for abnormal growths or skin changes, a visual exam of the cervix, and then an exam to evaluate the size and shape of the uterus and  ovaries.  And after 58 years of age, a rectal exam is indicated to check for rectal cancers. We will also provide age appropriate advice regarding health maintenance, like when you should have certain vaccines, screening for sexually transmitted diseases, bone density testing, colonoscopy, mammograms, etc.   MAMMOGRAMS:  All women over 40 years old should have a yearly mammogram. Many facilities now offer a "3D" mammogram, which may cost around $50 extra out of pocket. If possible,  we recommend you accept the option to have the 3D mammogram performed.  It both reduces the number of women who will be called back for extra views which then turn out to be normal, and it is better than the routine mammogram at detecting truly abnormal areas.    COLONOSCOPY:  Colonoscopy to screen for colon cancer is recommended for all women at age 50.  We know, you hate the idea of the prep.  We agree, BUT, having colon cancer and not knowing it is worse!!  Colon cancer so often starts as a polyp that can be seen and removed at colonscopy, which can quite literally save your life!  And if your first colonoscopy is normal and you have no family history of colon cancer, most women don't have to have it again for 10 years.  Once every ten years, you can do something that may end up saving your life, right?  We will be happy to help you get it scheduled when you are ready.    Be sure to check your insurance coverage so you understand how much it will cost.  It may be covered as a preventative service at no cost, but you should check your particular policy.      Urinary Incontinence  Urinary incontinence refers to a condition in which a person is unable to control where and when to pass urine. A person with this condition will urinate when he or she does not mean to (involuntarily). What are the causes? This condition may be caused by:  Medicines.  Infections.  Constipation.  Overactive bladder muscles.  Weak bladder  muscles.  Weak pelvic floor muscles. These muscles provide support for the bladder, intestine, and, in women, the uterus.  Enlarged prostate in men. The prostate is a gland near the bladder. When it gets too big, it can pinch the urethra. With the urethra blocked, the bladder can weaken and lose the ability to empty properly.  Surgery.  Emotional factors, such as anxiety, stress, or post-traumatic stress disorder (PTSD).  Pelvic organ prolapse. This happens in women when organs shift out of place and into the vagina. This shift can prevent the bladder and urethra from working properly. What increases the risk? The following factors may make you more likely to develop this condition:  Older age.  Obesity and physical inactivity.  Pregnancy and childbirth.  Menopause.  Diseases that affect the nerves or spinal cord (neurological diseases).  Long-term (chronic) coughing. This can increase pressure on the bladder and pelvic floor muscles. What are the signs or symptoms? Symptoms may vary depending on the type of urinary incontinence you have. They include:  A sudden urge to urinate, but passing urine involuntarily before you can get to a bathroom (urge incontinence).  Suddenly passing urine with any activity that forces urine to pass, such as coughing, laughing, exercise, or sneezing (stress incontinence).  Needing to urinate often, but urinating only a small amount, or constantly dribbling urine (overflow incontinence).  Urinating because you cannot get to the bathroom in time due to a physical disability, such as arthritis or injury, or communication and thinking problems, such as Alzheimer disease (functional incontinence). How is this diagnosed? This condition may be diagnosed based on:  Your medical history.  A physical exam.  Tests, such as: ? Urine tests. ? X-rays of your kidney and bladder. ? Ultrasound. ? CT scan. ? Cystoscopy. In this procedure, a health care  provider inserts a tube with a light and camera (cystoscope) through the urethra and into the bladder in order to check for problems. ? Urodynamic testing. These tests assess how well the bladder, urethra, and sphincter can store and release urine. There are different types of urodynamic tests, and they vary depending on what the test is measuring. To help diagnose your condition, your health care provider may recommend that you keep a log of when you urinate and how much you urinate. How is this treated? Treatment for this condition depends on the type of incontinence that you have and its cause. Treatment may include:  Lifestyle changes, such as: ? Quitting smoking. ? Maintaining a healthy weight. ? Staying active. Try to get 150 minutes of moderate-intensity exercise every week. Ask your health care provider which activities are safe for you. ? Eating a healthy diet.  Avoid high-fat foods, like fried foods.  Avoid refined carbohydrates like white bread and white rice.  Limit how much alcohol and caffeine you drink.  Increase your fiber intake. Foods such as fresh fruits, vegetables, beans,  and whole grains are healthy sources of fiber.  Pelvic floor muscle exercises.  Bladder training, such as lengthening the amount of time between bathroom breaks, or using the bathroom at regular intervals.  Using techniques to suppress bladder urges. This can include distraction techniques or controlled breathing exercises.  Medicines to relax the bladder muscles and prevent bladder spasms.  Medicines to help slow or prevent the growth of a man's prostate.  Botox injections. These can help relax the bladder muscles.  Using pulses of electricity to help change bladder reflexes (electrical nerve stimulation).  For women, using a medical device to prevent urine leaks. This is a small, tampon-like, disposable device that is inserted into the urethra.  Injecting collagen or carbon beads (bulking  agents) into the urinary sphincter. These can help thicken tissue and close the bladder opening.  Surgery. Follow these instructions at home: Lifestyle  Limit alcohol and caffeine. These can fill your bladder quickly and irritate it.  Keep yourself clean to help prevent odors and skin damage. Ask your doctor about special skin creams and cleansers that can protect the skin from urine.  Consider wearing pads or adult diapers. Make sure to change them regularly, and always change them right after experiencing incontinence. General instructions  Take over-the-counter and prescription medicines only as told by your health care provider.  Use the bathroom about every 3-4 hours, even if you do not feel the need to urinate. Try to empty your bladder completely every time. After urinating, wait a minute. Then try to urinate again.  Make sure you are in a relaxed position while urinating.  If your incontinence is caused by nerve problems, keep a log of the medicines you take and the times you go to the bathroom.  Keep all follow-up visits as told by your health care provider. This is important. Contact a health care provider if:  You have pain that gets worse.  Your incontinence gets worse. Get help right away if:  You have a fever or chills.  You are unable to urinate.  You have redness in your groin area or down your legs. Summary  Urinary incontinence refers to a condition in which a person is unable to control where and when to pass urine.  This condition may be caused by medicines, infection, weak bladder muscles, weak pelvic floor muscles, enlargement of the prostate (in men), or surgery.  The following factors increase your risk for developing this condition: older age, obesity, pregnancy and childbirth, menopause, neurological diseases, and chronic coughing.  There are several types of urinary incontinence. They include urge incontinence, stress incontinence, overflow  incontinence, and functional incontinence.  This condition is usually treated first with lifestyle and behavioral changes, such as quitting smoking, eating a healthier diet, and doing regular pelvic floor exercises. Other treatment options include medicines, bulking agents, medical devices, electrical nerve stimulation, or surgery. This information is not intended to replace advice given to you by your health care provider. Make sure you discuss any questions you have with your health care provider. Document Revised: 04/02/2017 Document Reviewed: 07/02/2016 Elsevier Patient Education  [place].

## 2019-05-03 ENCOUNTER — Ambulatory Visit
Admission: RE | Admit: 2019-05-03 | Discharge: 2019-05-03 | Disposition: A | Discharge status: Home / Self Care | Payer: BLUE CROSS/BLUE SHIELD | Source: Ambulatory Visit | Attending: Certified Nurse Midwife | Admitting: Certified Nurse Midwife

## 2019-05-03 ENCOUNTER — Other Ambulatory Visit: Payer: Self-pay

## 2019-05-03 ENCOUNTER — Ambulatory Visit: Payer: BLUE CROSS/BLUE SHIELD

## 2019-05-03 DIAGNOSIS — Z1231 Encounter for screening mammogram for malignant neoplasm of breast: Secondary | ICD-10-CM | POA: Diagnosis not present

## 2019-05-03 LAB — LIPID PANEL
Chol/HDL Ratio: 3.2 ratio (ref 0.0–4.4)
Cholesterol, Total: 234 mg/dL — ABNORMAL HIGH (ref 100–199)
HDL: 74 mg/dL (ref >39)
LDL Chol Calc (NIH): 150 mg/dL — ABNORMAL HIGH (ref 0–99)
Triglycerides: 59 mg/dL (ref 0–149)
VLDL Cholesterol Cal: 10 mg/dL (ref 5–40)

## 2019-05-03 LAB — CYTOLOGY - PAP: Diagnosis: NEGATIVE

## 2019-05-03 NOTE — Progress Notes (Signed)
Mammogram negative Density C Repeat yearly

## 2019-05-23 DIAGNOSIS — C20 Malignant neoplasm of rectum: Secondary | ICD-10-CM | POA: Diagnosis not present

## 2019-06-26 ENCOUNTER — Encounter: Payer: Self-pay | Admitting: Certified Nurse Midwife

## 2019-06-26 ENCOUNTER — Telehealth: Payer: Self-pay

## 2019-06-26 NOTE — Telephone Encounter (Signed)
Patient called to request the name of the physician that was referred to them for surgery regarding bladder leakage.

## 2019-06-26 NOTE — Telephone Encounter (Signed)
Left message for pt to call back to triage RN. Pt was referred to Dr Quincy Simmonds per AEX note on 05/02/2019 with DL.

## 2019-06-27 NOTE — Telephone Encounter (Signed)
Pt returned call. Pt wanted to know the Dr that Manon Hilding had referred to for stress incontinence and possible surgery. Pt informed per AEX from DL on 05/02/2019 that it is Dr Quincy Simmonds. Pt agreeable. Pt wanting to discuss next plan and steps in care. Pt scheduled for video visit on 07/03/2019 at 4:30 pm with Dr Quincy Simmonds to discuss.  Pt verbalized understanding and is aware of how to use mychart. Pt has hx of rectal CA in 2015.  Routing to Dr Quincy Simmonds for review and will close encounter.

## 2019-06-28 NOTE — Telephone Encounter (Signed)
Please schedule an in person visit with me.  Cc - Kaitlyn Sprague.

## 2019-06-28 NOTE — Telephone Encounter (Signed)
Spoke back with pt. Pt scheduled for OV on 07/05/2019 at 8 am. Pt agreeable and verbalized understanding.   Routing to Dr Quincy Simmonds for review and encounter closed.

## 2019-07-03 ENCOUNTER — Telehealth: Payer: Self-pay | Admitting: Obstetrics and Gynecology

## 2019-07-04 ENCOUNTER — Telehealth: Payer: Self-pay

## 2019-07-04 NOTE — Telephone Encounter (Signed)
Thank you for the update!

## 2019-07-04 NOTE — Telephone Encounter (Signed)
Patient left message on answering machine wanting to cancel appointment for tomorrow (07/05/19) at 8:00am. Left message on patients voicemail to call back and reschedule.

## 2019-07-04 NOTE — Progress Notes (Deleted)
GYNECOLOGY  VISIT   HPI: 59 y.o.   Married  Caucasian  female   G2P2002 with Patient's last menstrual period was 12/16/2013.   here for     GYNECOLOGIC HISTORY: Patient's last menstrual period was 12/16/2013. Contraception:  Vasectomy Menopausal hormone therapy: none Last mammogram: 05-03-19 3D/Neg/density C/BiRads1 Last pap smear:  05-02-19 Neg,03-24-17 neg, 04-07-2018 neg HPV HR neg        OB History    Gravida  2   Para  2   Term  2   Preterm  0   AB  0   Living  2     SAB  0   TAB  0   Ectopic  0   Multiple  0   Live Births  2              Patient Active Problem List   Diagnosis Date Noted  . Finger pain, right 03/24/2016  . Right shoulder pain 03/24/2016  . History of rectal or anal cancer 09/05/2015  . Abnormality of gait 12/19/2014  . 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 Loss of height 09/05/2014  . Rectal cancer ([place]) 01/11/2014  . Dyslipidemia 07/13/2011  . 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 Allergy    latex  . Cancer ([place]) 12/26/13 bx   rectal invasive adenocarcinoma  . Coronary artery disease   . Heart disease 2008   w/LAD and 3 stents  . 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 Hypercholesterolemia   . PONV (postoperative nausea and vomiting)     Past Surgical History:  Procedure Laterality Date  . CARDIAC SURGERY  06/2006   stents x 3  . CLOSURE OF ENTEROSTOMY, LARGE OR SMALL INTESTINE; WITH RESECTION AND ANASTOMOSIS OTHER THAN COLORECTAL  11/01/14   rectal cancer  . COLONOSCOPY WITH PROPOFOL N/A 12/26/2013   Procedure: COLONOSCOPY WITH PROPOFOL;  Surgeon: Garlan Fair, MD;  Location: [facility];  Service: Endoscopy;  Laterality: N/A;  . CORONARY ANGIOPLASTY     3 stents  . EUS N/A 01/03/2014   Procedure: LOWER ENDOSCOPIC ULTRASOUND (EUS);  Surgeon: Arta Silence, MD;  Location: [facility];   Service: Endoscopy;  Laterality: N/A;  . PROCTECTOMY W/COLONIC RESERVOIR, open low anterior resection, coloanal anastomosis, colonic J-pouch, loop ilesotomy, ERAS  05/02/14   rectal cancer    Current Outpatient Medications  Medication Sig Dispense Refill  . ascorbic acid (VITAMIN C) 1000 MG tablet Take by mouth.    . Cholecalciferol (D 1000) 1000 units capsule Take 1,000 Units by mouth.     . Cyanocobalamin (VITAMIN B 12 PO) Take by mouth.      No current facility-administered medications for this visit.     ALLERGIES: Latex and Prochlorperazine  Family History  Problem Relation Age of Onset  . Heart disease Mother   . Heart disease Father        Psychologist, forensic  . Heart disease Brother   . Marfan syndrome Brother        age 65 dissection    Social History   Socioeconomic History  . Marital status: Married    Spouse name: Not on file  . Number of children: 2  . Years of education: Not on file  . Highest education level: Not on file  Occupational History    Employer: [employer]  Tobacco Use  . Smoking  status: Never Smoker  . Smokeless tobacco: Never Used  Substance and Sexual Activity  . Alcohol use: No  . Drug use: No  . Sexual activity: Yes    Partners: Male    Birth control/protection: Other-see comments    Comment: vasectomy  Other Topics Concern  . Not on file  Social History Narrative   The patient is married with two children.Does not smoke    7 hours of sleep per night   Lives with her husband. hh of 2  Both kids in college.(2)   2 dogs in the home.      Tennis  Pre cancer treatment.   Hikes a lot    orig from  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 Difficulty of Paying Living Expenses:   Food Insecurity:   . Worried About Charity fundraiser in the Last Year:   . Arboriculturist in the Last Year:   Transportation  Needs:   . Film/video editor (Medical):   Marland Kitchen Lack of Transportation (Non-Medical):   Physical Activity:   . Days of Exercise per Week:   . Minutes of Exercise per Session:   Stress:   . Feeling of Stress :   Social Connections:   . Frequency of Communication with Friends and Family:   . Frequency of Social Gatherings with Friends and Family:   . Attends Religious Services:   . Active Member of Clubs or Organizations:   . Attends Archivist Meetings:   Marland Kitchen Marital Status:   Intimate Partner Violence:   . Fear of Current or Ex-Partner:   . Emotionally Abused:   Marland Kitchen Physically Abused:   . Sexually Abused:     Review of Systems  PHYSICAL EXAMINATION:    LMP 12/16/2013 Comment: one every 5 months    General appearance: alert, cooperative and appears stated age Head: Normocephalic, without obvious abnormality, atraumatic Neck: no adenopathy, supple, symmetrical, trachea midline and thyroid normal to inspection and palpation Lungs: clear to auscultation bilaterally Breasts: normal appearance, no masses or tenderness, No nipple retraction or dimpling, No nipple discharge or bleeding, No axillary or supraclavicular adenopathy Heart: regular rate and rhythm Abdomen: soft, non-tender, no masses,  no organomegaly Extremities: extremities normal, atraumatic, no cyanosis or edema Skin: Skin color, texture, turgor normal. No rashes or lesions Lymph nodes: Cervical, supraclavicular, and axillary nodes normal. No abnormal inguinal nodes palpated Neurologic: Grossly normal  Pelvic: External genitalia:  no lesions              Urethra:  normal appearing urethra with no masses, tenderness or lesions              Bartholins and Skenes: normal                 Vagina: normal appearing vagina with normal color and discharge, no lesions              Cervix: no lesions                Bimanual Exam:  Uterus:  normal size, contour, position, consistency, mobility, non-tender               Adnexa: no mass, fullness, tenderness  Rectal exam: {yes no:[id number]}.  Confirms.              Anus:  normal sphincter tone, no lesions  Chaperone was present for exam.  ASSESSMENT     PLAN     An After Visit Summary was printed and given to the patient.  ______ minutes face to face time of which over 50% was spent in counseling.

## 2019-07-05 ENCOUNTER — Ambulatory Visit: Payer: BC Managed Care – PPO | Admitting: Obstetrics and Gynecology

## 2019-08-11 ENCOUNTER — Encounter: Payer: Self-pay | Admitting: Sports Medicine

## 2019-08-11 ENCOUNTER — Ambulatory Visit: Payer: BC Managed Care – PPO | Admitting: Sports Medicine

## 2019-08-11 ENCOUNTER — Other Ambulatory Visit: Payer: Self-pay

## 2019-08-11 VITALS — BP 100/62 | Ht 65.0 in | Wt 130.0 lb

## 2019-08-11 DIAGNOSIS — R269 Unspecified abnormalities of gait and mobility: Secondary | ICD-10-CM | POA: Diagnosis not present

## 2019-08-11 NOTE — Progress Notes (Addendum)
   [facility] [address] [place], [place] [address] Phone: [phone] Fax: [phone]   Patient Name: Melanie Cook Date of Birth: May 01, 1960 Medical Record Number: [id number] Gender: female Date of Encounter: 08/11/2019  SUBJECTIVE:    HPI: Patient is here today to be fitted for standard orthotic.  She last had custom orthotics made at least 6 years ago and brings them with her today. Pt is being treated for pes cavus.  BP 100/62   Ht 5\' 5"  (1.651 m)   Wt 130 lb (59 kg)   LMP 12/16/2013 Comment: one every 5 months  BMI 21.63 kg/m   Patient was fitted for a standard, cushioned, semi-rigid orthotic.  The orthotic was heated and the patient stood on the orthotic blank positioned on the orthotic stand. The patient was positioned in subtalar neutral position and 10 degrees of ankle dorsiflexion in a weight bearing stance. After molding, a stable Fast-Tech EVA base was applied to the orthotic blank.   The blank was ground to a stable position for weight bearing. Size: 8 base: Blue EVA posting: none additional orthotic padding: none  Patient understood RTC needs and will follow up prn.  She is scheduled to go on a hiking trip in a couple weeks, I explained to break these orthotics and for a week, and if there is any discomfort she can bring them back for readjustment.  Lanier Clam, DO, ATC Sports Medicine Fellow  I was the preceptor for this visit and available for immediate consultation Shellia Cleverly, DO

## 2020-01-09 IMAGING — MG 2D DIGITAL SCREENING BILATERAL MAMMOGRAM WITH 3D TOMO WITH CAD
9 of 12 series · 9 of 28 positions shown · non-contrast
Comparison: Previous exam(s).

CLINICAL DATA: Screening.

EXAM:
2D DIGITAL SCREENING BILATERAL MAMMOGRAM WITH 3D TOMO WITH CAD

[L MLO synth-2D]
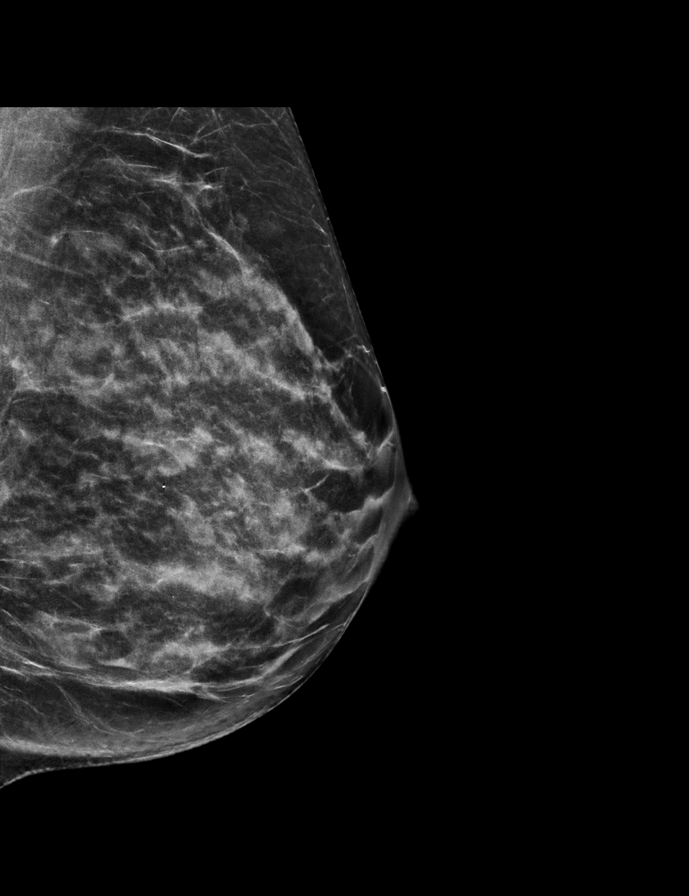

[R MLO]
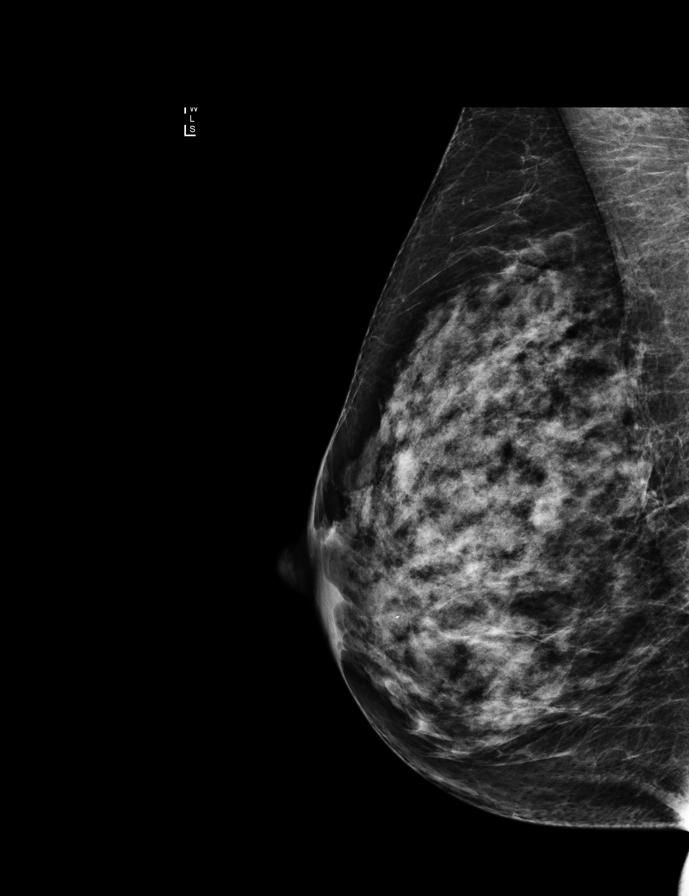

[L CC synth-2D]
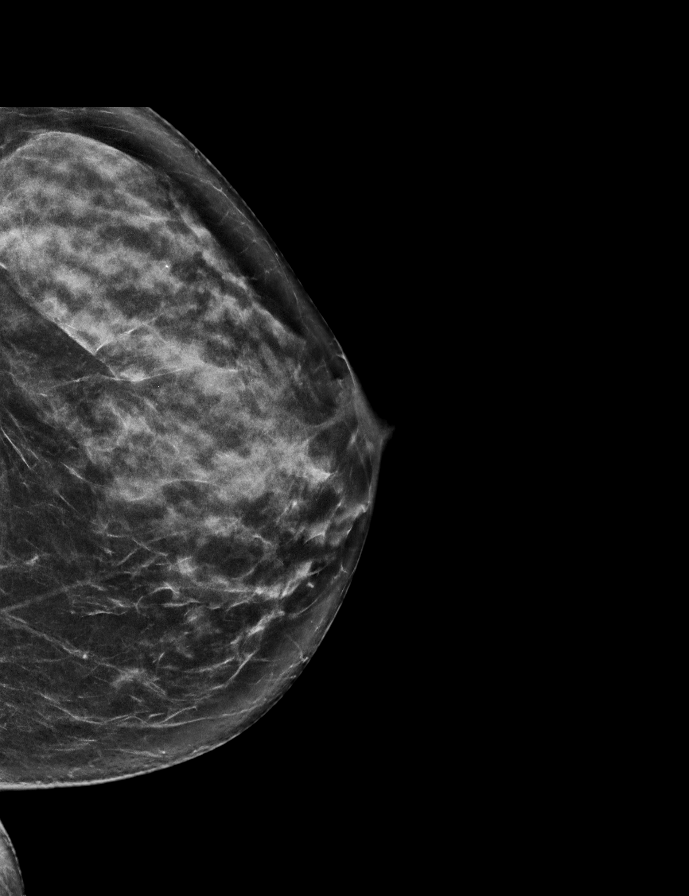

[R CC synth-2D]
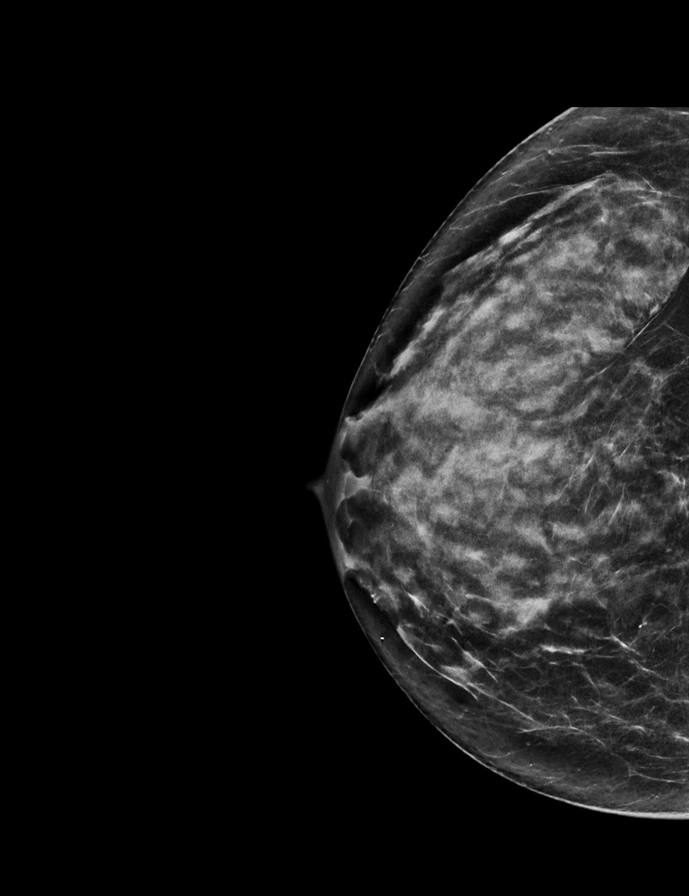

[L CC]
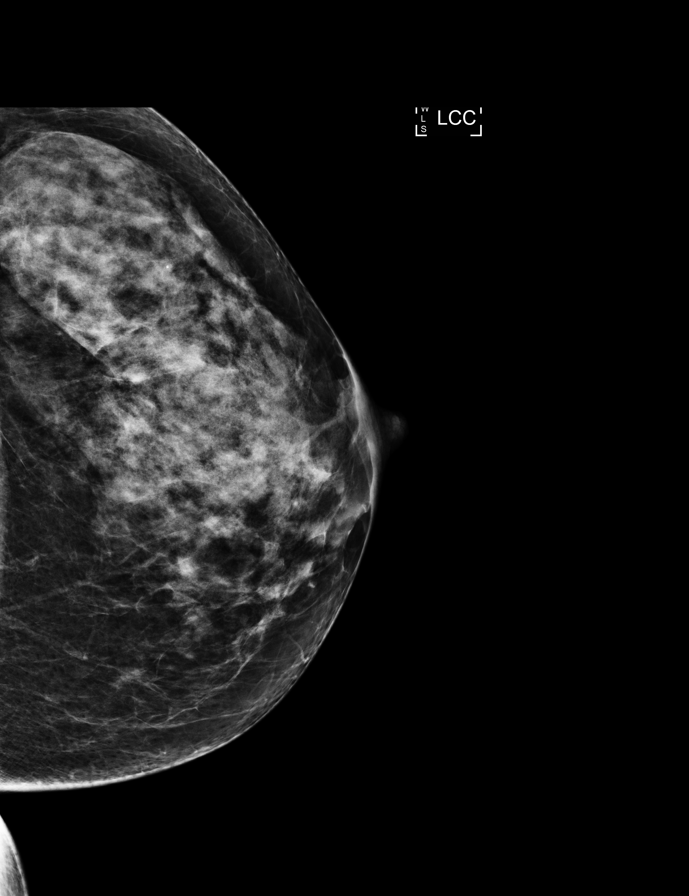

[L MLO]
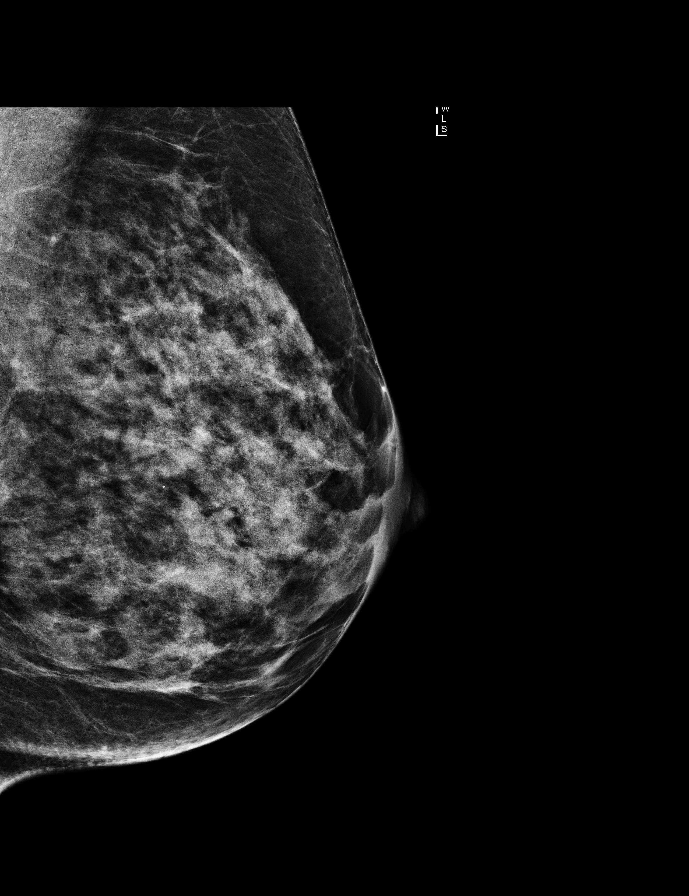

[R MLO synth-2D]
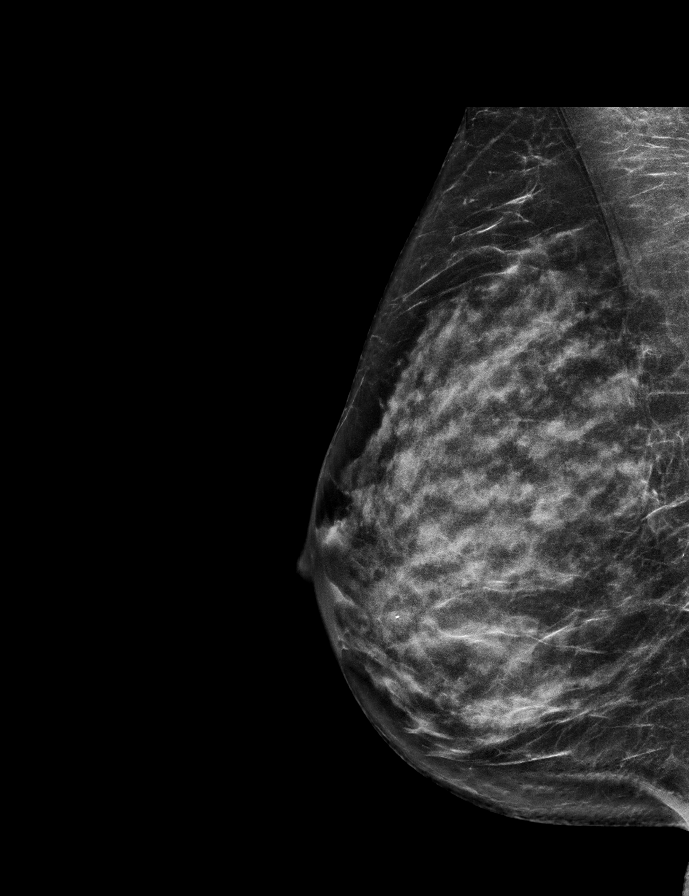

[R CC]
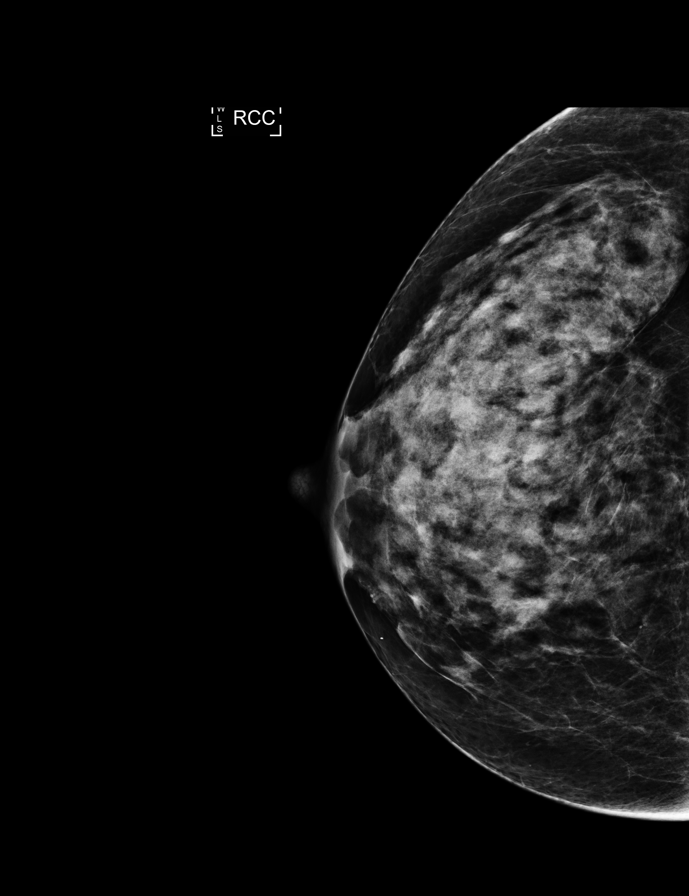

[R CC tomo · tomo slice 33/64.0]
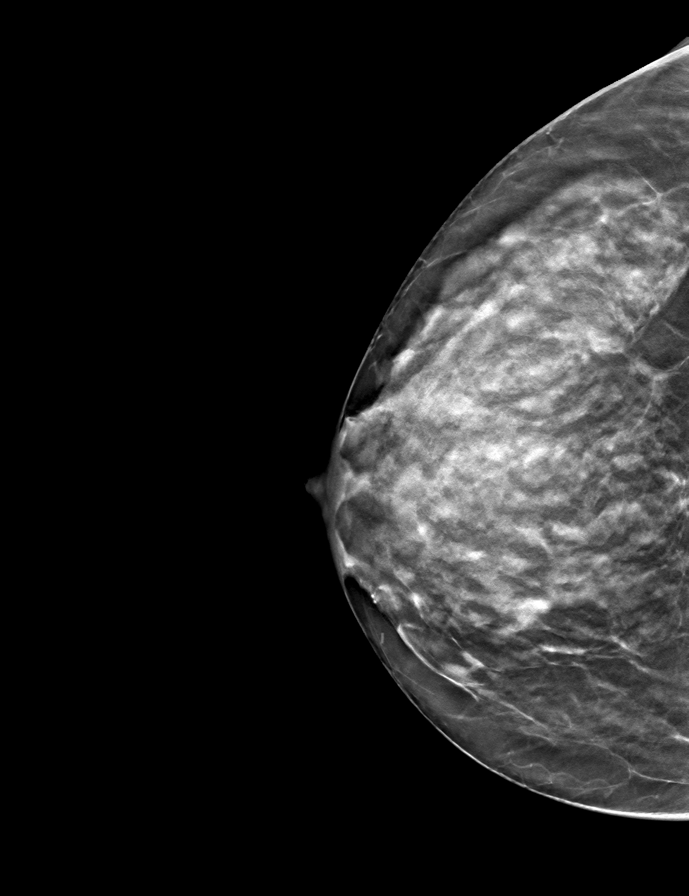

[9 of 28 positions shown; findings below may reference images not displayed]

ACR Breast Density Category c: The breast tissue is heterogeneously
dense, which may obscure small masses.
FINDINGS: There are no findings suspicious for malignancy. Images were
processed with CAD.
IMPRESSION: No mammographic evidence of malignancy. A result letter of this
screening mammogram will be mailed directly to the patient.

RECOMMENDATION:
Screening mammogram in one year. (Code:UA-9-KQN)

BI-RADS CATEGORY  1: Negative.

## 2020-03-20 DIAGNOSIS — D2261 Melanocytic nevi of right upper limb, including shoulder: Secondary | ICD-10-CM | POA: Diagnosis not present

## 2020-03-20 DIAGNOSIS — D225 Melanocytic nevi of trunk: Secondary | ICD-10-CM | POA: Diagnosis not present

## 2020-03-20 DIAGNOSIS — L814 Other melanin hyperpigmentation: Secondary | ICD-10-CM | POA: Diagnosis not present

## 2020-03-20 DIAGNOSIS — L821 Other seborrheic keratosis: Secondary | ICD-10-CM | POA: Diagnosis not present

## 2020-04-01 ENCOUNTER — Other Ambulatory Visit: Payer: Self-pay | Admitting: Obstetrics and Gynecology

## 2020-04-01 DIAGNOSIS — Z1231 Encounter for screening mammogram for malignant neoplasm of breast: Secondary | ICD-10-CM

## 2020-04-13 DIAGNOSIS — Z1152 Encounter for screening for COVID-19: Secondary | ICD-10-CM | POA: Diagnosis not present

## 2020-04-22 ENCOUNTER — Ambulatory Visit: Payer: BC Managed Care – PPO | Admitting: Internal Medicine

## 2020-04-23 DIAGNOSIS — Z20822 Contact with and (suspected) exposure to covid-19: Secondary | ICD-10-CM | POA: Diagnosis not present

## 2020-04-23 DIAGNOSIS — Z1159 Encounter for screening for other viral diseases: Secondary | ICD-10-CM | POA: Diagnosis not present

## 2020-05-03 ENCOUNTER — Ambulatory Visit: Payer: BC Managed Care – PPO | Admitting: Certified Nurse Midwife

## 2020-05-13 ENCOUNTER — Ambulatory Visit
Admission: RE | Admit: 2020-05-13 | Discharge: 2020-05-13 | Disposition: A | Discharge status: Home / Self Care | Payer: BC Managed Care – PPO | Source: Ambulatory Visit | Attending: Obstetrics and Gynecology | Admitting: Obstetrics and Gynecology

## 2020-05-13 ENCOUNTER — Other Ambulatory Visit: Payer: Self-pay

## 2020-05-13 DIAGNOSIS — Z1231 Encounter for screening mammogram for malignant neoplasm of breast: Secondary | ICD-10-CM

## 2020-05-14 DIAGNOSIS — Z01419 Encounter for gynecological examination (general) (routine) without abnormal findings: Secondary | ICD-10-CM | POA: Diagnosis not present

## 2020-05-14 DIAGNOSIS — Z6823 Body mass index (BMI) 23.0-23.9, adult: Secondary | ICD-10-CM | POA: Diagnosis not present

## 2020-05-19 NOTE — Progress Notes (Signed)
Cardiology Office Note   Date:  05/20/2020   ID:  Melanie Cook, DOB 02/03/1961, MRN [id number]  PCP:  Melanie Medin, MD  Cardiologist:   Melanie Carnes, MD   F/U of lipids      History of Present Illness: Melanie Cook is a 60 y.o. female with a history of spontaneous coronary artery dissection of LAD in 2008  At that time she underwent  PTCA/DES x 3  Also seen by Dr Melanie Cook at [facility] after  Her brother had an aortic dissection and his son has aneurysm of the aorta.  No genetic connection found The pt also has a history of HL and rectal cancer (she completed surgery, chemotherapy and XRT)  CT scan in 2019 showed some coronary calcifications      I saw the pt in Dec 2020 Since seen the pt denies CP   Breathing is OK   She has recovered from [place]  Medication Sig  . Cholecalciferol 25 MCG (1000 UT) capsule Take 1,000 Units by mouth.   . Cyanocobalamin (VITAMIN B 12 PO) Take by mouth.      Allergies:   Latex and Prochlorperazine   Past Medical History:  Diagnosis Date  . Allergy    latex  . Cancer ([place]) 12/26/13 bx   rectal invasive adenocarcinoma  . Coronary artery disease   . Heart disease 2008   w/LAD and 3 stents  . 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 Hypercholesterolemia   . PONV (postoperative nausea and vomiting)     Past Surgical History:  Procedure Laterality Date  . CARDIAC SURGERY  06/2006   stents x 3  . CLOSURE OF ENTEROSTOMY, LARGE OR SMALL INTESTINE; WITH RESECTION AND ANASTOMOSIS OTHER THAN COLORECTAL  11/01/14   rectal cancer  . COLONOSCOPY WITH PROPOFOL N/A 12/26/2013   Procedure: COLONOSCOPY WITH PROPOFOL;  Surgeon: Melanie Fair, MD;  Location: [facility];  Service: Endoscopy;  Laterality: N/A;  . CORONARY ANGIOPLASTY     3 stents  . EUS N/A 01/03/2014   Procedure: LOWER ENDOSCOPIC ULTRASOUND (EUS);  Surgeon: Melanie Silence, MD;  Location: [facility];  Service: Endoscopy;  Laterality: N/A;  . PROCTECTOMY  W/COLONIC RESERVOIR, open low anterior resection, coloanal anastomosis, colonic J-pouch, loop ilesotomy, ERAS  05/02/14   rectal cancer     Social History:  The patient  reports that she has never smoked. She has never used smokeless tobacco. She reports that she does not drink alcohol and does not use drugs.   Family History:  The patient's family history includes Heart disease in her brother, father, and mother; Marfan syndrome in her brother.    ROS:  Please see the history of present illness. All other systems are reviewed and  Negative to the above problem except as noted.    PHYSICAL EXAM: VS:  BP 110/62   Pulse (!) 58   Ht 5\' 6"  (1.676 m)   Wt 145 lb 12.8 oz (66.1 kg)   LMP 12/16/2013 Comment: one every 5 months  BMI 23.53 kg/m   GEN: Well nourished, well developed, in no acute distress  HEENT: normal  Neck: JVP is normal  No carotid bruits Cardiac: RRR; no murmurs,  No LE edema  Respiratory:  clear to auscultation bilaterally,  GI: soft, nontender, nondistended No hepatomegaly  MS: NO obvious abnomalities     Skin: warm and dry Neuro: Grossly intact  Psych: euthymic mood, full affect   EKG:  EKG is ordered today.  Sinus bradycardia   58 bpm    Lipid Panel    Component Value Date/Time   CHOL 234 (H) 05/02/2019 0858   TRIG 59 05/02/2019 0858   HDL 74 05/02/2019 0858   CHOLHDL 3.2 05/02/2019 0858   CHOLHDL 3 09/05/2015 0859   VLDL 13.8 09/05/2015 0859   LDLCALC 150 (H) 05/02/2019 0858      Wt Readings from Last 3 Encounters:  05/20/20 145 lb 12.8 oz (66.1 kg)  08/11/19 130 lb (59 kg)  05/02/19 135 lb (61.2 kg)      ASSESSMENT AND PLAN:  1  Hx of SCAD  Doing well   Has not had problem since then   FOllow   2    Lipids  Will set up for fasting lipomed at her convenience  Stay active    F/U in [place] in 1 year   WIll review labs when she has them done    Current medicines are reviewed at length with the patient today.  The patient does not have  concerns regarding medicines.  Signed, Melanie Carnes, MD  05/20/2020 8:29 AM    [facility] [place], [place], [place]  [address] Phone: [phone]; Fax: [phone]

## 2020-05-20 ENCOUNTER — Ambulatory Visit: Payer: BC Managed Care – PPO | Admitting: Internal Medicine

## 2020-05-20 ENCOUNTER — Other Ambulatory Visit: Payer: Self-pay

## 2020-05-20 ENCOUNTER — Encounter: Payer: Self-pay | Admitting: Internal Medicine

## 2020-05-20 VITALS — BP 110/62 | HR 58 | Ht 66.0 in | Wt 145.8 lb

## 2020-05-20 DIAGNOSIS — E785 Hyperlipidemia, unspecified: Secondary | ICD-10-CM

## 2020-05-20 NOTE — Patient Instructions (Signed)
Medication Instructions:  Your physician recommends that you continue on your current medications as directed. Please refer to the Current Medication list given to you today.  *If you need a refill on your cardiac medications before your next appointment, please call your pharmacy*   Lab Work: Lipomed fasting   If you have labs (blood work) drawn today and your tests are completely normal, you will receive your results only by: Marland Kitchen MyChart Message (if you have MyChart) OR . A paper copy in the mail If you have any lab test that is abnormal or we need to change your treatment, we will call you to review the results.   Testing/Procedures: none   Follow-Up: At [facility], you and your health needs are our priority.  As part of our continuing mission to provide you with exceptional heart care, we have created designated Provider Care Teams.  These Care Teams include your primary Cardiologist (physician) and Advanced Practice Providers (APPs -  Physician Assistants and Nurse Practitioners) who all work together to provide you with the care you need, when you need it.  We recommend signing up for the patient portal called "MyChart".  Sign up information is provided on this After Visit Summary.  MyChart is used to connect with patients for Virtual Visits (Telemedicine).  Patients are able to view lab/test results, encounter notes, upcoming appointments, etc.  Non-urgent messages can be sent to your provider as well.   To learn more about what you can do with MyChart, go to [email or web address].    Your next appointment:   1 year(s)  The format for your next appointment:   In Person  Provider:   Dorris Carnes, MD   Other Instructions

## 2020-05-21 DIAGNOSIS — C20 Malignant neoplasm of rectum: Secondary | ICD-10-CM | POA: Diagnosis not present

## 2020-06-03 ENCOUNTER — Other Ambulatory Visit: Payer: BC Managed Care – PPO

## 2020-06-20 DIAGNOSIS — Z13820 Encounter for screening for osteoporosis: Secondary | ICD-10-CM | POA: Diagnosis not present

## 2020-07-08 ENCOUNTER — Other Ambulatory Visit: Payer: BC Managed Care – PPO | Admitting: *Deleted

## 2020-07-08 ENCOUNTER — Other Ambulatory Visit: Payer: Self-pay

## 2020-07-08 DIAGNOSIS — E785 Hyperlipidemia, unspecified: Secondary | ICD-10-CM | POA: Diagnosis not present

## 2020-07-09 LAB — NMR, LIPOPROFILE
Cholesterol, Total: 254 mg/dL — ABNORMAL HIGH (ref 100–199)
HDL Particle Number: 38.8 umol/L (ref >=30.5)
HDL-C: 67 mg/dL (ref >39)
LDL Particle Number: 1831 nmol/L — ABNORMAL HIGH (ref <1000)
LDL Size: 21.8 nm (ref >20.5)
LDL-C (NIH Calc): 176 mg/dL — ABNORMAL HIGH (ref 0–99)
LP-IR Score: <25 (ref <=45)
Small LDL Particle Number: 365 nmol/L (ref <=527)
Triglycerides: 67 mg/dL (ref 0–149)

## 2020-07-09 LAB — APOLIPOPROTEIN B: Apolipoprotein B: 124 mg/dL — ABNORMAL HIGH (ref <90)

## 2020-07-09 LAB — LIPOPROTEIN A (LPA): Lipoprotein (a): <8.4 nmol/L (ref <75.0)

## 2020-07-11 ENCOUNTER — Telehealth: Payer: Self-pay | Admitting: *Deleted

## 2020-07-11 DIAGNOSIS — E785 Hyperlipidemia, unspecified: Secondary | ICD-10-CM

## 2020-07-11 MED ORDER — ROSUVASTATIN CALCIUM 10 MG PO TABS: 10.0000 mg | ORAL_TABLET | Freq: Every day | ORAL | 3 refills | Status: DC

## 2020-07-11 NOTE — Telephone Encounter (Signed)
Fay Records, MD  07/11/2020 9:29 AM EDT Back to Top     Reviewed labs with patient. CT recently showed mild atherosclerosis of aorta. Would recommend starting Crestor 10 mg.  Follow up lipomed panel and liver function in 10-12 weeks.   ______________________________________________________________________________  Crestor 10 mg sent to pharmacy.  Lab orders for 3 months placed.  Message to pt via MY Chart.

## 2020-07-29 DIAGNOSIS — Z0100 Encounter for examination of eyes and vision without abnormal findings: Secondary | ICD-10-CM | POA: Diagnosis not present

## 2020-12-20 ENCOUNTER — Encounter: Payer: Self-pay | Admitting: Internal Medicine

## 2021-03-04 ENCOUNTER — Encounter: Payer: Self-pay | Admitting: Internal Medicine

## 2021-03-04 ENCOUNTER — Other Ambulatory Visit: Payer: Self-pay

## 2021-03-04 ENCOUNTER — Ambulatory Visit (AMBULATORY_SURGERY_CENTER): Payer: Self-pay | Admitting: *Deleted

## 2021-03-04 VITALS — Ht 66.0 in | Wt 139.0 lb

## 2021-03-04 DIAGNOSIS — Z85038 Personal history of other malignant neoplasm of large intestine: Secondary | ICD-10-CM

## 2021-03-04 DIAGNOSIS — Z86010 Personal history of colonic polyps: Secondary | ICD-10-CM

## 2021-03-04 MED ORDER — NA SULFATE-K SULFATE-MG SULF 17.5-3.13-1.6 GM/177ML PO SOLN: 1.0000 | Freq: Once | ORAL | 0 refills | Status: AC

## 2021-03-04 NOTE — Progress Notes (Signed)
No egg or soy allergy known to patient  issues known to pt with past sedation with any surgeries or procedures of PONV  Patient denies ever being told they had issues or difficulty with intubation  No FH of Malignant Hyperthermia Pt is not on diet pills Pt is not on  home 02  Pt is not on blood thinners  Pt denies issues with constipation  No A fib or A flutter  Pt is fully vaccinated  for Covid   NO PA's for preps discussed with pt In PV today  Discussed with pt there will be an out-of-pocket cost for prep and that varies from $0 to 70 +  dollars - pt verbalized understanding   Due to the COVID-19 pandemic we are asking patients to follow certain guidelines in PV and the [place]   Pt aware of COVID protocols and [facility] guidelines -  In person PV today

## 2021-03-11 DIAGNOSIS — D225 Melanocytic nevi of trunk: Secondary | ICD-10-CM | POA: Diagnosis not present

## 2021-03-11 DIAGNOSIS — L814 Other melanin hyperpigmentation: Secondary | ICD-10-CM | POA: Diagnosis not present

## 2021-03-11 DIAGNOSIS — D2261 Melanocytic nevi of right upper limb, including shoulder: Secondary | ICD-10-CM | POA: Diagnosis not present

## 2021-03-11 DIAGNOSIS — L570 Actinic keratosis: Secondary | ICD-10-CM | POA: Diagnosis not present

## 2021-03-11 DIAGNOSIS — L821 Other seborrheic keratosis: Secondary | ICD-10-CM | POA: Diagnosis not present

## 2021-03-18 ENCOUNTER — Other Ambulatory Visit: Payer: Self-pay

## 2021-03-18 ENCOUNTER — Ambulatory Visit (AMBULATORY_SURGERY_CENTER): Payer: BC Managed Care – PPO | Admitting: Internal Medicine

## 2021-03-18 ENCOUNTER — Encounter: Payer: Self-pay | Admitting: Internal Medicine

## 2021-03-18 VITALS — BP 107/68 | HR 79 | Temp 98.9°F | Resp 20 | Ht 66.0 in | Wt 139.0 lb

## 2021-03-18 DIAGNOSIS — Z85038 Personal history of other malignant neoplasm of large intestine: Secondary | ICD-10-CM

## 2021-03-18 DIAGNOSIS — D122 Benign neoplasm of ascending colon: Secondary | ICD-10-CM

## 2021-03-18 DIAGNOSIS — Z1211 Encounter for screening for malignant neoplasm of colon: Secondary | ICD-10-CM | POA: Diagnosis not present

## 2021-03-18 DIAGNOSIS — Z86010 Personal history of colonic polyps: Secondary | ICD-10-CM

## 2021-03-18 MED ORDER — SODIUM CHLORIDE 0.9 % IV SOLN: 500.0000 mL | Freq: Once | INTRAVENOUS | Status: DC

## 2021-03-18 NOTE — Progress Notes (Signed)
Sedate, gd SR, tolerated procedure well, VSS, report to RN 

## 2021-03-18 NOTE — Progress Notes (Signed)
GASTROENTEROLOGY PROCEDURE H&P NOTE   Primary Care Physician: Burnis Medin, MD    Reason for Procedure:  History of colon cancer  Plan:    Colonoscopy  Patient is appropriate for endoscopic procedure(s) in the ambulatory ([place]) setting.  The nature of the procedure, as well as the risks, benefits, and alternatives were carefully and thoroughly reviewed with the patient. Ample time for discussion and questions allowed. The patient understood, was satisfied, and agreed to proceed.     HPI: Melanie Cook is a 60 y.o. female who presents for surveillance colonoscopy in the setting of personal history of colon cancer and colon polyps.  Medical history as below.  Last exam December 2019 at [facility].  Tolerated the prep.  No recent chest pain or shortness of breath.  No abdominal pain today.  Past Medical History:  Diagnosis Date   Allergy    latex   Cancer ([place]) 12/26/13 bx   rectal invasive adenocarcinoma   Coronary artery disease    Heart disease 2008   w/LAD and 3 stents   HSV infection since teens   cold sore - oral and vaginal   Hypercholesterolemia    Personal history of chemotherapy    ended 2015   Personal history of radiation therapy    2015   PONV (postoperative nausea and vomiting)     Past Surgical History:  Procedure Laterality Date   CARDIAC SURGERY  06/05/2006   stents x 3   CLOSURE OF ENTEROSTOMY, LARGE OR SMALL INTESTINE; WITH RESECTION AND ANASTOMOSIS OTHER THAN COLORECTAL  11/01/2014   rectal cancer   COLONOSCOPY     COLONOSCOPY WITH PROPOFOL N/A 12/26/2013   Procedure: COLONOSCOPY WITH PROPOFOL;  Surgeon: Garlan Fair, MD;  Location: [facility];  Service: Endoscopy;  Laterality: N/A;   CORONARY ANGIOPLASTY     3 stents   EUS N/A 01/03/2014   Procedure: LOWER ENDOSCOPIC ULTRASOUND (EUS);  Surgeon: Arta Silence, MD;  Location: [facility];  Service: Endoscopy;  Laterality: N/A;   POLYPECTOMY     PROCTECTOMY W/COLONIC RESERVOIR, open low  anterior resection, coloanal anastomosis, colonic J-pouch, loop ilesotomy, ERAS  05/02/2014   rectal cancer    Prior to Admission medications   Medication Sig Start Date End Date Taking? Authorizing Provider  Cholecalciferol 25 MCG (1000 UT) capsule Take 1,000 Units by mouth.   Yes [provider]  Cyanocobalamin (VITAMIN B 12 PO) Take by mouth.   Yes [provider]  rosuvastatin (CRESTOR) 10 MG tablet Take 1 tablet (10 mg total) by mouth daily. 07/11/20  Yes Fay Records, MD    Current Outpatient Medications  Medication Sig Dispense Refill   Cholecalciferol 25 MCG (1000 UT) capsule Take 1,000 Units by mouth.     Cyanocobalamin (VITAMIN B 12 PO) Take by mouth.     rosuvastatin (CRESTOR) 10 MG tablet Take 1 tablet (10 mg total) by mouth daily. 90 tablet 3   Current Facility-Administered Medications  Medication Dose Route Frequency Provider Last Rate Last Admin   0.9 %  sodium chloride infusion  500 mL Intravenous Once Chelsy Parrales, Lajuan Lines, MD        Allergies as of 03/18/2021 - Review Complete 03/18/2021  Allergen Reaction Noted   Latex Rash 12/26/2013   Prochlorperazine Nausea And Vomiting 09/05/2014    Family History  Problem Relation Age of Onset   Heart disease Mother    Heart disease Father        pace maker   Heart  disease Brother    Marfan syndrome Brother        age 30 dissection   Colon cancer Neg Hx    Colon polyps Neg Hx    Esophageal cancer Neg Hx    Rectal cancer Neg Hx    Stomach cancer Neg Hx     Social History   Socioeconomic History   Marital status: Married    Spouse name: Not on file   Number of children: 2   Years of education: Not on file   Highest education level: Not on file  Occupational History    Employer: [employer]  Tobacco Use   Smoking status: Never   Smokeless tobacco: Never  Vaping Use   Vaping Use: Never used  Substance and Sexual Activity   Alcohol use: No   Drug use: No   Sexual activity: Yes     Partners: Male    Birth control/protection: Other-see comments    Comment: vasectomy  Other Topics Concern   Not on file  Social History Narrative   The patient is married with two children.Does not smoke    7 hours of sleep per night   Lives with her husband. hh of 2  Both kids in college.(2)   2 dogs in the home.      Tennis  Pre cancer treatment.   Hikes a lot    orig from  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    Social Determinants of Radio broadcast assistant Strain: Not on file  Food Insecurity: Not on file  Transportation Needs: Not on file  Physical Activity: Not on file  Stress: Not on file  Social Connections: Not on file  Intimate Partner Violence: Not on file    Physical Exam: Vital signs in last 24 hours: @BP  109/62   Pulse 88   Temp 98.9 F (37.2 C)   Ht 5\' 6"  (1.676 m)   Wt 139 lb (63 kg)   LMP 12/16/2013 Comment: one every 5 months  SpO2 99%   BMI 22.44 kg/m  GEN: NAD EYE: Sclerae anicteric ENT: MMM CV: Non-tachycardic Pulm: CTA b/l GI: Soft, NT/ND NEURO:  Alert & Oriented x 3   Zenovia Jarred, MD [facility]  03/18/2021 8:04 AM

## 2021-03-18 NOTE — Patient Instructions (Signed)
Handout on polyps given to patient. Await pathology results. Resume previous diet and continue present medications. Repeat colonoscopy in 5 years for surveillance.   YOU HAD AN ENDOSCOPIC PROCEDURE TODAY AT [facility]:   Refer to the procedure report that was given to you for any specific questions about what was found during the examination.  If the procedure report does not answer your questions, please call your gastroenterologist to clarify.  If you requested that your care partner not be given the details of your procedure findings, then the procedure report has been included in a sealed envelope for you to review at your convenience later.  YOU SHOULD EXPECT: Some feelings of bloating in the abdomen. Passage of more gas than usual.  Walking can help get rid of the air that was put into your GI tract during the procedure and reduce the bloating. If you had a lower endoscopy (such as a colonoscopy or flexible sigmoidoscopy) you may notice spotting of blood in your stool or on the toilet paper. If you underwent a bowel prep for your procedure, you may not have a normal bowel movement for a few days.  Please Note:  You might notice some irritation and congestion in your nose or some drainage.  This is from the oxygen used during your procedure.  There is no need for concern and it should clear up in a day or so.  SYMPTOMS TO REPORT IMMEDIATELY:  Following lower endoscopy (colonoscopy or flexible sigmoidoscopy):  Excessive amounts of blood in the stool  Significant tenderness or worsening of abdominal pains  Swelling of the abdomen that is new, acute  Fever of 100F or higher  For urgent or emergent issues, a gastroenterologist can be reached at any hour by calling [phone]. Do not use MyChart messaging for urgent concerns.    DIET:  We do recommend a small meal at first, but then you may proceed to your regular diet.  Drink plenty of fluids but you should avoid  alcoholic beverages for 24 hours.  ACTIVITY:  You should plan to take it easy for the rest of today and you should NOT DRIVE or use heavy machinery until tomorrow (because of the sedation medicines used during the test).    FOLLOW UP: Our staff will call the number listed on your records 48-72 hours following your procedure to check on you and address any questions or concerns that you may have regarding the information given to you following your procedure. If we do not reach you, we will leave a message.  We will attempt to reach you two times.  During this call, we will ask if you have developed any symptoms of COVID 19. If you develop any symptoms (ie: fever, flu-like symptoms, shortness of breath, cough etc.) before then, please call [phone].  If you test positive for Covid 19 in the 2 weeks post procedure, please call and report this information to [place].    If any biopsies were taken you will be contacted by phone or by letter within the next 1-3 weeks.  Please call us at [phone] if you have not heard about the biopsies in 3 weeks.    SIGNATURES/CONFIDENTIALITY: You and/or your care partner have signed paperwork which will be entered into your electronic medical record.  These signatures attest to the fact that that the information above on your After Visit Summary has been reviewed and is understood.  Full responsibility of the confidentiality of this discharge information lies  with you and/or your care-partner.  

## 2021-03-18 NOTE — Progress Notes (Signed)
Called to room to assist during endoscopic procedure.  Patient ID and intended procedure confirmed with present staff. Received instructions for my participation in the procedure from the performing physician.  

## 2021-03-18 NOTE — Progress Notes (Signed)
Pt's states no medical or surgical changes since previsit or office visit. 

## 2021-03-18 NOTE — Op Note (Signed)
[facility] Patient Name: Melanie Cook Procedure Date: 03/18/2021 7:21 AM MRN: [id number] Endoscopist: Jerene Bears , MD Age: 60 Referring MD:  Date of Birth: 1960/06/25 Gender: Female Account #: [id number] Procedure:                Colonoscopy Indications:              High risk colon cancer surveillance: Personal                            history of colorectal cancer, Last colonoscopy 3                            years ago Medicines:                Monitored Anesthesia Care Procedure:                Pre-Anesthesia Assessment:                           - Prior to the procedure, a History and Physical                            was performed, and patient medications and                            allergies were reviewed. The patient's tolerance of                            previous anesthesia was also reviewed. The risks                            and benefits of the procedure and the sedation                            options and risks were discussed with the patient.                            All questions were answered, and informed consent                            was obtained. Prior Anticoagulants: The patient has                            taken no previous anticoagulant or antiplatelet                            agents. ASA Grade Assessment: II - A patient with                            mild systemic disease. After reviewing the risks                            and benefits, the patient was deemed in  satisfactory condition to undergo the procedure.                           After obtaining informed consent, the colonoscope                            was passed under direct vision. Throughout the                            procedure, the patient's blood pressure, pulse, and                            oxygen saturations were monitored continuously. The                            PCF-HQ190L Colonoscope was introduced through the                             anus and advanced to the terminal ileum. The                            colonoscopy was performed without difficulty. The                            patient tolerated the procedure well. The quality                            of the bowel preparation was good. The terminal                            ileum, ileocecal valve, appendiceal orifice, and                            rectum were photographed. Scope In: 8:11:41 AM Scope Out: 8:31:16 AM Scope Withdrawal Time: 0 hours 14 minutes 40 seconds  Total Procedure Duration: 0 hours 19 minutes 35 seconds  Findings:                 The digital rectal exam was normal.                           A 6 mm polyp was found in the ascending colon. The                            polyp was sessile. The polyp was removed with a                            cold snare. Resection and retrieval were complete.                           There was evidence of a prior end-to-side                            colo-colonic anastomosis in the mid rectum. This  was patent and was characterized by healthy                            appearing mucosa.                           Retroflexion in the rectum was not performed due to                            post-surgical anatomy. Complications:            No immediate complications. Estimated Blood Loss:     Estimated blood loss was minimal. Impression:               - One 6 mm polyp in the ascending colon, removed                            with a cold snare. Resected and retrieved.                           - Patent end-to-side colo-colonic anastomosis,                            characterized by healthy appearing mucosa. Recommendation:           - Patient has a contact number available for                            emergencies. The signs and symptoms of potential                            delayed complications were discussed with the                            patient. Return  to normal activities tomorrow.                            Written discharge instructions were provided to the                            patient.                           - Resume previous diet.                           - Continue present medications.                           - Await pathology results.                           - Repeat colonoscopy in 5 years for surveillance. Jerene Bears, MD 03/18/2021 8:35:50 AM This report has been signed electronically.

## 2021-03-20 ENCOUNTER — Telehealth: Payer: Self-pay

## 2021-03-20 ENCOUNTER — Encounter: Payer: Self-pay | Admitting: Internal Medicine

## 2021-03-20 NOTE — Telephone Encounter (Signed)
°  Follow up Call-  Call back number 03/18/2021  Post procedure Call Back phone  # [phone]  Permission to leave phone message Yes  Some recent data might be hidden     Patient questions:  Do you have a fever, pain , or abdominal swelling? No. Pain Score  0 *  Have you tolerated food without any problems? Yes.    Have you been able to return to your normal activities? Yes.    Do you have any questions about your discharge instructions: Diet   No. Medications  No. Follow up visit  No.  Do you have questions or concerns about your Care? No.  Actions: * If pain score is 4 or above: No action needed, pain <4.

## 2021-04-01 ENCOUNTER — Other Ambulatory Visit: Payer: Self-pay | Admitting: Obstetrics and Gynecology

## 2021-04-01 DIAGNOSIS — Z1231 Encounter for screening mammogram for malignant neoplasm of breast: Secondary | ICD-10-CM

## 2021-04-09 ENCOUNTER — Other Ambulatory Visit: Payer: Self-pay | Admitting: Internal Medicine

## 2021-05-14 ENCOUNTER — Ambulatory Visit: Payer: BC Managed Care – PPO

## 2021-05-19 ENCOUNTER — Ambulatory Visit
Admission: RE | Admit: 2021-05-19 | Discharge: 2021-05-19 | Disposition: A | Discharge status: Home / Self Care | Payer: BC Managed Care – PPO | Source: Ambulatory Visit | Attending: Obstetrics and Gynecology | Admitting: Obstetrics and Gynecology

## 2021-05-19 DIAGNOSIS — Z1231 Encounter for screening mammogram for malignant neoplasm of breast: Secondary | ICD-10-CM | POA: Diagnosis not present

## 2021-05-20 DIAGNOSIS — C20 Malignant neoplasm of rectum: Secondary | ICD-10-CM | POA: Diagnosis not present

## 2021-05-29 DIAGNOSIS — Z1151 Encounter for screening for human papillomavirus (HPV): Secondary | ICD-10-CM | POA: Diagnosis not present

## 2021-05-29 DIAGNOSIS — Z6823 Body mass index (BMI) 23.0-23.9, adult: Secondary | ICD-10-CM | POA: Diagnosis not present

## 2021-05-29 DIAGNOSIS — Z124 Encounter for screening for malignant neoplasm of cervix: Secondary | ICD-10-CM | POA: Diagnosis not present

## 2021-05-29 DIAGNOSIS — Z01419 Encounter for gynecological examination (general) (routine) without abnormal findings: Secondary | ICD-10-CM | POA: Diagnosis not present

## 2021-07-22 DIAGNOSIS — N393 Stress incontinence (female) (male): Secondary | ICD-10-CM | POA: Diagnosis not present

## 2021-08-12 ENCOUNTER — Telehealth: Payer: Self-pay | Admitting: Internal Medicine

## 2021-08-12 MED ORDER — ROSUVASTATIN CALCIUM 10 MG PO TABS: ORAL_TABLET | ORAL | 0 refills | Status: DC

## 2021-08-12 NOTE — Telephone Encounter (Signed)
Pt's medication was sent to pt's pharmacy as requested. Confirmation received.  °

## 2021-08-12 NOTE — Telephone Encounter (Signed)
?*  STAT* If patient is at the pharmacy, call can be transferred to refill team. ? ? ?1. Which medications need to be refilled? (please list name of each medication and dose if known) Rosuvastatin ? ?2. Which pharmacy/location (including street and city if local pharmacy) is medication to be sent to?[facility] [place], [place] ? ?3. Do they need a 30 day or 90 day supply? 90 days and refill- have appointment in June ? ?

## 2021-09-19 ENCOUNTER — Telehealth: Payer: Self-pay | Admitting: Internal Medicine

## 2021-09-19 DIAGNOSIS — Z79899 Other long term (current) drug therapy: Secondary | ICD-10-CM

## 2021-09-19 DIAGNOSIS — E785 Hyperlipidemia, unspecified: Secondary | ICD-10-CM

## 2021-09-19 NOTE — Telephone Encounter (Signed)
Patient has upcoming lab with me at end of month   Please have her get lipomed and ApoB several days (probably 4 or 5) prior so that results back in time

## 2021-09-22 NOTE — Telephone Encounter (Signed)
Pt to have fasting labs 09/24/21.

## 2021-09-22 NOTE — Addendum Note (Signed)
Addended by: Stephani Police on: 09/22/2021 09:01 AM   Modules accepted: Orders

## 2021-09-24 ENCOUNTER — Other Ambulatory Visit: Payer: BC Managed Care – PPO

## 2021-09-24 DIAGNOSIS — E785 Hyperlipidemia, unspecified: Secondary | ICD-10-CM

## 2021-09-24 DIAGNOSIS — Z79899 Other long term (current) drug therapy: Secondary | ICD-10-CM

## 2021-09-26 LAB — NMR, LIPOPROFILE
Cholesterol, Total: 145 mg/dL (ref 100–199)
HDL Particle Number: 42.6 umol/L (ref >=30.5)
HDL-C: 70 mg/dL (ref >39)
LDL Particle Number: 829 nmol/L (ref <1000)
LDL Size: 20.5 nm — ABNORMAL LOW (ref >20.5)
LDL-C (NIH Calc): 61 mg/dL (ref 0–99)
LP-IR Score: <25 (ref <=45)
Small LDL Particle Number: 411 nmol/L (ref <=527)
Triglycerides: 69 mg/dL (ref 0–149)

## 2021-09-26 LAB — APOLIPOPROTEIN B: Apolipoprotein B: 57 mg/dL (ref <90)

## 2021-09-29 ENCOUNTER — Ambulatory Visit: Payer: BC Managed Care – PPO | Admitting: Internal Medicine

## 2021-09-29 ENCOUNTER — Encounter: Payer: Self-pay | Admitting: Internal Medicine

## 2021-09-29 VITALS — BP 102/54 | HR 62 | Ht 66.0 in | Wt 142.4 lb

## 2021-09-29 DIAGNOSIS — I7090 Unspecified atherosclerosis: Secondary | ICD-10-CM

## 2021-09-29 MED ORDER — ROSUVASTATIN CALCIUM 10 MG PO TABS: ORAL_TABLET | ORAL | 3 refills | Status: DC

## 2021-10-10 DIAGNOSIS — N393 Stress incontinence (female) (male): Secondary | ICD-10-CM | POA: Diagnosis not present

## 2021-10-17 DIAGNOSIS — N393 Stress incontinence (female) (male): Secondary | ICD-10-CM | POA: Diagnosis not present

## 2021-10-20 DIAGNOSIS — N393 Stress incontinence (female) (male): Secondary | ICD-10-CM | POA: Diagnosis not present

## 2021-10-31 DIAGNOSIS — N393 Stress incontinence (female) (male): Secondary | ICD-10-CM | POA: Diagnosis not present

## 2021-11-08 ENCOUNTER — Other Ambulatory Visit: Payer: Self-pay | Admitting: Internal Medicine

## 2022-01-27 DIAGNOSIS — N393 Stress incontinence (female) (male): Secondary | ICD-10-CM | POA: Diagnosis not present

## 2022-02-02 DIAGNOSIS — H18892 Other specified disorders of cornea, left eye: Secondary | ICD-10-CM | POA: Diagnosis not present

## 2022-03-10 DIAGNOSIS — L821 Other seborrheic keratosis: Secondary | ICD-10-CM | POA: Diagnosis not present

## 2022-03-10 DIAGNOSIS — L570 Actinic keratosis: Secondary | ICD-10-CM | POA: Diagnosis not present

## 2022-03-10 DIAGNOSIS — D225 Melanocytic nevi of trunk: Secondary | ICD-10-CM | POA: Diagnosis not present

## 2022-03-10 DIAGNOSIS — D2261 Melanocytic nevi of right upper limb, including shoulder: Secondary | ICD-10-CM | POA: Diagnosis not present

## 2022-03-10 DIAGNOSIS — L814 Other melanin hyperpigmentation: Secondary | ICD-10-CM | POA: Diagnosis not present

## 2022-04-09 ENCOUNTER — Other Ambulatory Visit: Payer: Self-pay | Admitting: Obstetrics and Gynecology

## 2022-04-09 DIAGNOSIS — Z1231 Encounter for screening mammogram for malignant neoplasm of breast: Secondary | ICD-10-CM

## 2022-05-29 ENCOUNTER — Ambulatory Visit
Admission: RE | Admit: 2022-05-29 | Discharge: 2022-05-29 | Disposition: A | Discharge status: Home / Self Care | Payer: BC Managed Care – PPO | Source: Ambulatory Visit | Attending: Obstetrics and Gynecology | Admitting: Obstetrics and Gynecology

## 2022-05-29 DIAGNOSIS — Z1231 Encounter for screening mammogram for malignant neoplasm of breast: Secondary | ICD-10-CM | POA: Diagnosis not present

## 2022-06-02 DIAGNOSIS — Z23 Encounter for immunization: Secondary | ICD-10-CM | POA: Diagnosis not present

## 2022-06-02 DIAGNOSIS — Z85048 Personal history of other malignant neoplasm of rectum, rectosigmoid junction, and anus: Secondary | ICD-10-CM | POA: Diagnosis not present

## 2022-06-02 DIAGNOSIS — Z08 Encounter for follow-up examination after completed treatment for malignant neoplasm: Secondary | ICD-10-CM | POA: Diagnosis not present

## 2022-06-02 DIAGNOSIS — Z9221 Personal history of antineoplastic chemotherapy: Secondary | ICD-10-CM | POA: Diagnosis not present

## 2022-06-02 DIAGNOSIS — C20 Malignant neoplasm of rectum: Secondary | ICD-10-CM | POA: Diagnosis not present

## 2022-06-23 DIAGNOSIS — Z124 Encounter for screening for malignant neoplasm of cervix: Secondary | ICD-10-CM | POA: Diagnosis not present

## 2022-06-23 DIAGNOSIS — Z6823 Body mass index (BMI) 23.0-23.9, adult: Secondary | ICD-10-CM | POA: Diagnosis not present

## 2022-06-23 DIAGNOSIS — Z1151 Encounter for screening for human papillomavirus (HPV): Secondary | ICD-10-CM | POA: Diagnosis not present

## 2022-06-23 DIAGNOSIS — Z01419 Encounter for gynecological examination (general) (routine) without abnormal findings: Secondary | ICD-10-CM | POA: Diagnosis not present

## 2022-06-23 DIAGNOSIS — Z13820 Encounter for screening for osteoporosis: Secondary | ICD-10-CM | POA: Diagnosis not present

## 2022-08-03 ENCOUNTER — Ambulatory Visit: Payer: BC Managed Care – PPO | Admitting: Family Medicine

## 2022-08-03 ENCOUNTER — Encounter: Payer: Self-pay | Admitting: Family Medicine

## 2022-08-03 VITALS — BP 104/68 | Ht 65.5 in | Wt 140.0 lb

## 2022-08-03 DIAGNOSIS — R269 Unspecified abnormalities of gait and mobility: Secondary | ICD-10-CM

## 2022-08-03 NOTE — Progress Notes (Signed)
PCP: Madelin Headings, MD  Subjective:   HPI: Patient is a 62 y.o. female here for custom orthotics.  Patient has done well in past with custom orthotics though recent ones from 3 years ago no longer feel supportive, would like a new pair. No pain currently. She is active with pickleball and hiking.  Past Medical History:  Diagnosis Date   Allergy    latex   Cancer (HCC) 12/26/13 bx   rectal invasive adenocarcinoma   Coronary artery disease    Heart disease 2008   w/LAD and 3 stents   HSV infection since teens   cold sore - oral and vaginal   Hypercholesterolemia    Personal history of chemotherapy    ended 2015   Personal history of radiation therapy    2015   PONV (postoperative nausea and vomiting)     Current Outpatient Medications on File Prior to Visit  Medication Sig Dispense Refill   Cholecalciferol 25 MCG (1000 UT) capsule Take 1,000 Units by mouth.     Cyanocobalamin (VITAMIN B 12 PO) Take by mouth.     rosuvastatin (CRESTOR) 10 MG tablet TAKE 1 TABLET BY MOUTH DAILY 90 tablet 3   No current facility-administered medications on file prior to visit.    Past Surgical History:  Procedure Laterality Date   CARDIAC SURGERY  06/05/2006   stents x 3   CLOSURE OF ENTEROSTOMY, LARGE OR SMALL INTESTINE; WITH RESECTION AND ANASTOMOSIS OTHER THAN COLORECTAL  11/01/2014   rectal cancer   COLONOSCOPY     COLONOSCOPY WITH PROPOFOL N/A 12/26/2013   Procedure: COLONOSCOPY WITH PROPOFOL;  Surgeon: Charolett Bumpers, MD;  Location: [facility];  Service: Endoscopy;  Laterality: N/A;   CORONARY ANGIOPLASTY     3 stents   EUS N/A 01/03/2014   Procedure: LOWER ENDOSCOPIC ULTRASOUND (EUS);  Surgeon: Willis Modena, MD;  Location: [facility];  Service: Endoscopy;  Laterality: N/A;   POLYPECTOMY     PROCTECTOMY W/COLONIC RESERVOIR, open low anterior resection, coloanal anastomosis, colonic J-pouch, loop ilesotomy, ERAS  05/02/2014   rectal cancer    Allergies  Allergen  Reactions   Latex Rash   Prochlorperazine Nausea And Vomiting    Other reaction(s): Vomiting    BP 104/68   Ht 5' 5.5" (1.664 m)   Wt 140 lb (63.5 kg)   LMP 03/06/2012 (Approximate)   BMI 22.94 kg/m       No data to display              No data to display              Objective:  Physical Exam:  Gen: NAD, comfortable in exam room  Bilateral feet/ankles: Left moderate sized bunion with hallux valgus.  Minimal hallux rigidus bilaterally.  Transverse arch collapse with left 2nd hammer toe with small callus dorsally over DIP area.  Mod long arch collapse No tenderness. FROM ankles without pain. NVI distally.  Assessment & Plan:  1. Gait abnormality - no pain currently.  Done well with orthotics though recent pair no longer supportive.  New pair made today as below.  She may consider second pair as well.  F/u as needed otherwise.  Patient was fitted for a : standard, cushioned, semi-rigid orthotic. The orthotic was heated and afterward the patient stood on the orthotic blank positioned on the orthotic stand. The patient was positioned in subtalar neutral position and 10 degrees of ankle dorsiflexion in a weight bearing stance. After completion of molding,  a stable base was applied to the orthotic blank. The blank was ground to a stable position for weight bearing. Size: 8 fit & run Base: none Posting: none Additional orthotic padding: none though discussed consideration of adding metatarsal pads in future.

## 2022-08-05 DIAGNOSIS — Z0100 Encounter for examination of eyes and vision without abnormal findings: Secondary | ICD-10-CM | POA: Diagnosis not present

## 2022-12-08 ENCOUNTER — Other Ambulatory Visit: Payer: Self-pay | Admitting: *Deleted

## 2022-12-08 MED ORDER — ROSUVASTATIN CALCIUM 10 MG PO TABS: ORAL_TABLET | ORAL | 3 refills | Status: DC

## 2022-12-08 NOTE — Progress Notes (Signed)
Cardiology Office Note   Date:  12/09/2022   ID:  QUINNETTA MACEACHERN, DOB 04/30/1960, MRN [id number]  PCP:  Madelin Headings, MD  Cardiologist:   Dietrich Pates, MD   F/U of lipids      History of Present Illness: Melanie Cook is a 62 y.o. female with a history of spontaneous coronary artery dissection of LAD in 2008  At that time she underwent  PTCA/DES x 3  Also seen by Dr Izell Denton at [facility] after  Her brother had an aortic dissection and his son has aneurysm of the aorta.  No genetic connection found The pt also has a history of HL and rectal cancer (she completed surgery, chemotherapy and XRT)  CT scan in 2019 showed some coronary calcifications    In 2021 CT abdomen showed soft plaque in abdominal artery  I saw the pt in June 2023    Since seen she has done well   Breathing is good  NO CP   She remains very active  Exercises almost every day      Current Meds  Medication Sig   rosuvastatin (CRESTOR) 10 MG tablet TAKE 1 TABLET BY MOUTH DAILY     Allergies:   Latex and Prochlorperazine   Past Medical History:  Diagnosis Date   Allergy    latex   Cancer (HCC) 12/26/13 bx   rectal invasive adenocarcinoma   Coronary artery disease    Heart disease 2008   w/LAD and 3 stents   HSV infection since teens   cold sore - oral and vaginal   Hypercholesterolemia    Personal history of chemotherapy    ended 2015   Personal history of radiation therapy    2015   PONV (postoperative nausea and vomiting)     Past Surgical History:  Procedure Laterality Date   CARDIAC SURGERY  06/05/2006   stents x 3   CLOSURE OF ENTEROSTOMY, LARGE OR SMALL INTESTINE; WITH RESECTION AND ANASTOMOSIS OTHER THAN COLORECTAL  11/01/2014   rectal cancer   COLONOSCOPY     COLONOSCOPY WITH PROPOFOL N/A 12/26/2013   Procedure: COLONOSCOPY WITH PROPOFOL;  Surgeon: Charolett Bumpers, MD;  Location: [facility];  Service: Endoscopy;  Laterality: N/A;   CORONARY ANGIOPLASTY     3 stents   EUS N/A 01/03/2014    Procedure: LOWER ENDOSCOPIC ULTRASOUND (EUS);  Surgeon: Willis Modena, MD;  Location: [facility];  Service: Endoscopy;  Laterality: N/A;   POLYPECTOMY     PROCTECTOMY W/COLONIC RESERVOIR, open low anterior resection, coloanal anastomosis, colonic J-pouch, loop ilesotomy, ERAS  05/02/2014   rectal cancer     Social History:  The patient  reports that she has never smoked. She has never used smokeless tobacco. She reports that she does not drink alcohol and does not use drugs.   Family History:  The patient's family history includes Heart disease in her brother, father, and mother; Marfan syndrome in her brother.    ROS:  Please see the history of present illness. All other systems are reviewed and  Negative to the above problem except as noted.    PHYSICAL EXAM: VS:  BP 102/64   Pulse (!) 55   Ht 5' 5.5" (1.664 m)   Wt 143 lb 3.2 oz (65 kg)   LMP 03/06/2012 (Approximate)   SpO2 98%   BMI 23.47 kg/m   GEN: Well nourished, well developed, in no acute distress  HEENT: NCAT Neck: JVP is not elevated   No carotid  bruit Cardiac: RRR; no murmur,  No LE edema  Respiratory:  clear to auscultation bilaterally,  GI: soft, nontender, nondistended No hepatomegaly    EKG:  EKG is ordered today.  Sinus bradycardia   53 bpm   Nonspecitic ST changes  Lipid Panel    Component Value Date/Time   CHOL 234 (H) 05/02/2019 0858   TRIG 59 05/02/2019 0858   HDL 74 05/02/2019 0858   CHOLHDL 3.2 05/02/2019 0858   CHOLHDL 3 09/05/2015 0859   VLDL 13.8 09/05/2015 0859   LDLCALC 150 (H) 05/02/2019 0858      Wt Readings from Last 3 Encounters:  12/09/22 143 lb 3.2 oz (65 kg)  08/03/22 140 lb (63.5 kg)  09/29/21 142 lb 6.4 oz (64.6 kg)      ASSESSMENT AND PLAN:  1  Hx of SCAD  2008   s/p stents to LAD  No CP since   2    Lipids   Lpids are excellent.  CT scan of abdomen showed minimal atherosclerosis of aorta   ([facility]) WIll cut back on Crestor to 5 mg   Follow up lipomed in 8 wks      Check Vit D, A1C, lipomed      Follow up in 1 year, sooner for problems      Current medicines are reviewed at length with the patient today.  The patient does not have concerns regarding medicines.  Signed, Dietrich Pates, MD  12/09/2022 1:46 PM    [facility] [address] [place], [place], [place]  [address] Phone: [phone]; Fax: [phone]

## 2022-12-09 ENCOUNTER — Ambulatory Visit: Payer: BC Managed Care – PPO | Attending: Internal Medicine | Admitting: Internal Medicine

## 2022-12-09 ENCOUNTER — Encounter: Payer: Self-pay | Admitting: Internal Medicine

## 2022-12-09 VITALS — BP 102/64 | HR 55 | Ht 65.5 in | Wt 143.2 lb

## 2022-12-09 DIAGNOSIS — E785 Hyperlipidemia, unspecified: Secondary | ICD-10-CM | POA: Diagnosis not present

## 2023-02-02 ENCOUNTER — Ambulatory Visit: Payer: BC Managed Care – PPO | Admitting: Sports Medicine

## 2023-02-02 VITALS — BP 103/59 | Ht 65.5 in | Wt 140.0 lb

## 2023-02-02 DIAGNOSIS — M21612 Bunion of left foot: Secondary | ICD-10-CM | POA: Diagnosis not present

## 2023-02-02 NOTE — Assessment & Plan Note (Addendum)
-   Melanie Cook is having some overlap of the first and second digits on the left foot.  This is causing medial callus formation on the second digit.  She is also having some pseudo claw formation of the second toe causing rub on the proximal PIP and callus formation. - A metatarsal pad was added to her custom orthotics.  A custom spacer orthotic was made for the 1-2 and 2-3 digits.  This will not correct her valgus deviation but, along with the metatarsal pad can improve the friction between digits and on the top of her toe. - The patient is going to contact us via MyChart to let us know how the orthotic is working.  If causing significant relief we can construct a more permanent apparatus on follow-up. - The patient will schedule a follow-up appointment to bring her orthotics and for a metatarsal pad.

## 2023-02-02 NOTE — Progress Notes (Unsigned)
Melanie Cook - 62 y.o. female MRN [id number]  Date of birth: 1961-02-07  PCP: Madelin Headings, MD  Subjective:  No chief complaint on file.  Bunion of foot  HPI: Past Medical, Surgical, Social, and Family History Reviewed & Updated per EMR.   Patient is a 62 y.o. female here for pain due to her left bunion.  She is now having overlap of the first and second toes and some rubbing on the top of her shoe with the second toe.  This is caused some callus formation and is making walking difficult.  She has tried a small spacer before which fell out immediately.  She has been using her custom orthotics from before but today wore dress shoes.   Past Medical History:  Diagnosis Date   Allergy    latex   Cancer (HCC) 12/26/13 bx   rectal invasive adenocarcinoma   Coronary artery disease    Heart disease 2008   w/LAD and 3 stents   HSV infection since teens   cold sore - oral and vaginal   Hypercholesterolemia    Personal history of chemotherapy    ended 2015   Personal history of radiation therapy    2015   PONV (postoperative nausea and vomiting)     Current Outpatient Medications on File Prior to Visit  Medication Sig Dispense Refill   Cholecalciferol 25 MCG (1000 UT) capsule Take 1,000 Units by mouth. (Patient not taking: Reported on 12/09/2022)     Cyanocobalamin (VITAMIN B 12 PO) Take by mouth. (Patient not taking: Reported on 12/09/2022)     rosuvastatin (CRESTOR) 10 MG tablet TAKE 1 TABLET BY MOUTH DAILY 90 tablet 3   No current facility-administered medications on file prior to visit.    Past Surgical History:  Procedure Laterality Date   CARDIAC SURGERY  06/05/2006   stents x 3   CLOSURE OF ENTEROSTOMY, LARGE OR SMALL INTESTINE; WITH RESECTION AND ANASTOMOSIS OTHER THAN COLORECTAL  11/01/2014   rectal cancer   COLONOSCOPY     COLONOSCOPY WITH PROPOFOL N/A 12/26/2013   Procedure: COLONOSCOPY WITH PROPOFOL;  Surgeon: Charolett Bumpers, MD;  Location: [facility];  Service:  Endoscopy;  Laterality: N/A;   CORONARY ANGIOPLASTY     3 stents   EUS N/A 01/03/2014   Procedure: LOWER ENDOSCOPIC ULTRASOUND (EUS);  Surgeon: Willis Modena, MD;  Location: [facility];  Service: Endoscopy;  Laterality: N/A;   POLYPECTOMY     PROCTECTOMY W/COLONIC RESERVOIR, open low anterior resection, coloanal anastomosis, colonic J-pouch, loop ilesotomy, ERAS  05/02/2014   rectal cancer    Allergies  Allergen Reactions   Latex Rash   Prochlorperazine Nausea And Vomiting    Other reaction(s): Vomiting        Objective:  Physical Exam: VS: BP:(!) 103/59  HR: bpm  TEMP: ( )  RESP:   HT:5' 5.5" (166.4 cm)   WT:140 lb (63.5 kg)  BMI:22.93  Gen: NAD, speaks clearly, comfortable in exam room Respiratory: Normal respiratory effort on room air. No signs of distress Skin: No rashes, abrasions, or ecchymosis MSK: Inspection of the left foot shows significant valgus formation of the first digit There is chronic degenerative changes over the medial aspect of the first MTP The second digit is riding slightly over the first causing callus formation of the DIP of the second toe Callus formation over the second MTP plantar surface indicative of breakdown of the volar plate Pseudo claw formation with callus formation of the second dorsal PIP Breakdown  of the transverse arch bilaterally    Assessment & Plan:   Bunion of great toe of left foot - Melanie Cook is having some overlap of the first and second digits on the left foot.  This is causing medial callus formation on the second digit.  She is also having some pseudo claw formation of the second toe causing rub on the proximal PIP and callus formation. - A metatarsal pad was added to her custom orthotics.  A custom spacer orthotic was made for the 1-2 and 2-3 digits.  This will not correct her valgus deviation but, along with the metatarsal pad can improve the friction between digits and on the top of her toe. - The patient is going to  contact us via MyChart to let us know how the orthotic is working.  If causing significant relief we can construct a more permanent apparatus on follow-up. - The patient will schedule a follow-up appointment to bring her orthotics and for a metatarsal pad.     Rica Mote MD [facility] Fellow  I observed and examined the patient with the [facility] resident and agree with assessment and plan.  Note reviewed and modified by me. Sterling Big, MD

## 2023-02-04 ENCOUNTER — Encounter: Payer: Self-pay | Admitting: Sports Medicine

## 2023-04-14 DIAGNOSIS — D2261 Melanocytic nevi of right upper limb, including shoulder: Secondary | ICD-10-CM | POA: Diagnosis not present

## 2023-04-14 DIAGNOSIS — L814 Other melanin hyperpigmentation: Secondary | ICD-10-CM | POA: Diagnosis not present

## 2023-04-14 DIAGNOSIS — L821 Other seborrheic keratosis: Secondary | ICD-10-CM | POA: Diagnosis not present

## 2023-04-14 DIAGNOSIS — D225 Melanocytic nevi of trunk: Secondary | ICD-10-CM | POA: Diagnosis not present

## 2023-04-15 ENCOUNTER — Other Ambulatory Visit: Payer: Self-pay | Admitting: Obstetrics and Gynecology

## 2023-04-15 DIAGNOSIS — Z1231 Encounter for screening mammogram for malignant neoplasm of breast: Secondary | ICD-10-CM

## 2023-05-31 ENCOUNTER — Ambulatory Visit
Admission: RE | Admit: 2023-05-31 | Discharge: 2023-05-31 | Disposition: A | Discharge status: Home / Self Care | Payer: BC Managed Care – PPO | Source: Ambulatory Visit | Attending: Obstetrics and Gynecology | Admitting: Obstetrics and Gynecology

## 2023-05-31 DIAGNOSIS — Z1231 Encounter for screening mammogram for malignant neoplasm of breast: Secondary | ICD-10-CM | POA: Diagnosis not present

## 2023-06-01 DIAGNOSIS — Z85048 Personal history of other malignant neoplasm of rectum, rectosigmoid junction, and anus: Secondary | ICD-10-CM | POA: Diagnosis not present

## 2023-06-01 DIAGNOSIS — R194 Change in bowel habit: Secondary | ICD-10-CM | POA: Diagnosis not present

## 2023-06-01 DIAGNOSIS — C20 Malignant neoplasm of rectum: Secondary | ICD-10-CM | POA: Diagnosis not present

## 2023-06-01 DIAGNOSIS — Z08 Encounter for follow-up examination after completed treatment for malignant neoplasm: Secondary | ICD-10-CM | POA: Diagnosis not present

## 2023-06-01 DIAGNOSIS — Z23 Encounter for immunization: Secondary | ICD-10-CM | POA: Diagnosis not present

## 2023-07-26 DIAGNOSIS — Z6822 Body mass index (BMI) 22.0-22.9, adult: Secondary | ICD-10-CM | POA: Diagnosis not present

## 2023-07-26 DIAGNOSIS — Z124 Encounter for screening for malignant neoplasm of cervix: Secondary | ICD-10-CM | POA: Diagnosis not present

## 2023-07-26 DIAGNOSIS — Z1151 Encounter for screening for human papillomavirus (HPV): Secondary | ICD-10-CM | POA: Diagnosis not present

## 2023-07-26 DIAGNOSIS — Z01419 Encounter for gynecological examination (general) (routine) without abnormal findings: Secondary | ICD-10-CM | POA: Diagnosis not present

## 2023-08-04 DIAGNOSIS — R194 Change in bowel habit: Secondary | ICD-10-CM | POA: Diagnosis not present

## 2023-08-04 DIAGNOSIS — C20 Malignant neoplasm of rectum: Secondary | ICD-10-CM | POA: Diagnosis not present

## 2023-08-04 DIAGNOSIS — R109 Unspecified abdominal pain: Secondary | ICD-10-CM | POA: Diagnosis not present

## 2023-08-04 DIAGNOSIS — R110 Nausea: Secondary | ICD-10-CM | POA: Diagnosis not present

## 2023-09-17 DIAGNOSIS — E559 Vitamin D deficiency, unspecified: Secondary | ICD-10-CM | POA: Diagnosis not present

## 2023-09-17 DIAGNOSIS — Z1321 Encounter for screening for nutritional disorder: Secondary | ICD-10-CM | POA: Diagnosis not present

## 2023-09-17 DIAGNOSIS — Z77120 Contact with and (suspected) exposure to mold (toxic): Secondary | ICD-10-CM | POA: Diagnosis not present

## 2023-09-17 DIAGNOSIS — Z131 Encounter for screening for diabetes mellitus: Secondary | ICD-10-CM | POA: Diagnosis not present

## 2023-09-17 DIAGNOSIS — E782 Mixed hyperlipidemia: Secondary | ICD-10-CM | POA: Diagnosis not present

## 2023-09-17 DIAGNOSIS — R5383 Other fatigue: Secondary | ICD-10-CM | POA: Diagnosis not present

## 2023-09-17 DIAGNOSIS — Z1329 Encounter for screening for other suspected endocrine disorder: Secondary | ICD-10-CM | POA: Diagnosis not present

## 2023-09-17 DIAGNOSIS — N951 Menopausal and female climacteric states: Secondary | ICD-10-CM | POA: Diagnosis not present

## 2023-09-17 DIAGNOSIS — K589 Irritable bowel syndrome without diarrhea: Secondary | ICD-10-CM | POA: Diagnosis not present

## 2023-10-20 DIAGNOSIS — E782 Mixed hyperlipidemia: Secondary | ICD-10-CM | POA: Diagnosis not present

## 2023-10-20 DIAGNOSIS — N951 Menopausal and female climacteric states: Secondary | ICD-10-CM | POA: Diagnosis not present

## 2023-10-20 DIAGNOSIS — K589 Irritable bowel syndrome without diarrhea: Secondary | ICD-10-CM | POA: Diagnosis not present

## 2023-10-20 DIAGNOSIS — R5383 Other fatigue: Secondary | ICD-10-CM | POA: Diagnosis not present

## 2024-01-30 NOTE — Progress Notes (Signed)
 Melanie Console, PA-C [address] [place], [place]  [address] Phone: [phone]   Gastroenterology Consultation  Referring Provider:     No ref. provider found Primary Care Physician:  No primary care provider on file. Primary Gastroenterologist:  Melanie Console, PA-C / Dr. Gordy Starch  Reason for Consultation:     Discuss sooner colonoscopy        HPI:   Discussed the use of AI scribe software for clinical note transcription with the patient, who gave verbal consent to proceed.  History of Present Illness Melanie Cook is a 63 year old female with history of colorectal cancer who presents for follow-up regarding colonoscopy scheduling.  Diagnosed with colorectal cancer in 2015, she underwent surgery, chemotherapy, and radiation. She has been under the care of [facility]. Her last CT scan of the chest, abdomen, and pelvis in April was clear, and her CEA level was normal. Her last colonoscopy in December 2022 revealed one small precancerous polyp, while previous colonoscopies in December 2016 and December 2019 showed no polyps and two small polyps (one precancerous), respectively.  She is concerned about the five-year interval for her next colonoscopy due to her cautious nature and desire to avoid waiting too long. She has had three colonoscopies since her cancer diagnosis, with findings as mentioned above.  In April, she experienced severe left lower abdominal pain, described as 'knocked me, like I had to lie down.' A CT scan was performed in April which showed no acute abnormality.  She reports no recurrence of this pain since then.  She denies blood in stool, unusual weight loss, or significant changes in bowel habits, although bowel habits can be affected by certain foods, particularly when dining out.  She is followed by oncologist at [facility], Dr. Beaulah Earthly.  Diagnosed with rectal cancer 12/2013.  Treated with chemoradiation and lower anterior resection.  No evidence of  recurrence since then.  03/2021 last colonoscopy by Dr. Starch: 1 small 6 mm tubular adenoma polyp removed from ascending colon.  Prior end-to-side colocolonic anastomosis in the mid rectum with healthy mucosa.  Good prep.  5-year repeat colonoscopy was recommended (due 03/2026).  03/2018 colonoscopy: 2 small (5 mm to 7 mm) polyps removed from ascending colon.  Pathology showed 1 sessile serrated polyp and 1 colon mucosal polyp.  03/2015 colonoscopy: Normal.  12/26/13, colonoscopy revealed 5 cm mass in the distal rectum. Path c/w invasive adenocarcinoma.  08/04/2023 CT chest, abdomen, pelvis: No evidence of metastatic disease.  She last saw her oncologist at [facility] 07/2023.  She had normal CEA and normal abdominal pelvic CT.  Past Medical History:  Diagnosis Date   Allergy    latex   Cancer (HCC) 12/26/13 bx   rectal invasive adenocarcinoma   Coronary artery disease    Heart disease 2008   w/LAD and 3 stents   HSV infection since teens   cold sore - oral and vaginal   Hypercholesterolemia    Personal history of chemotherapy    ended 2015   Personal history of radiation therapy    2015   PONV (postoperative nausea and vomiting)     Past Surgical History:  Procedure Laterality Date   CARDIAC SURGERY  06/05/2006   stents x 3   CLOSURE OF ENTEROSTOMY, LARGE OR SMALL INTESTINE; WITH RESECTION AND ANASTOMOSIS OTHER THAN COLORECTAL  11/01/2014   rectal cancer   COLONOSCOPY     COLONOSCOPY WITH PROPOFOL  N/A 12/26/2013   Procedure: COLONOSCOPY WITH  PROPOFOL ;  Surgeon: Gladis MARLA Louder, MD;  Location: [facility];  Service: Endoscopy;  Laterality: N/A;   CORONARY ANGIOPLASTY     3 stents   EUS N/A 01/03/2014   Procedure: LOWER ENDOSCOPIC ULTRASOUND (EUS);  Surgeon: Elsie Cree, MD;  Location: [facility];  Service: Endoscopy;  Laterality: N/A;   POLYPECTOMY     PROCTECTOMY W/COLONIC RESERVOIR, open low anterior resection, coloanal anastomosis, colonic J-pouch, loop ilesotomy, ERAS   05/02/2014   rectal cancer    Prior to Admission medications   Medication Sig Start Date End Date Taking? Authorizing Provider  Cholecalciferol 25 MCG (1000 UT) capsule Take 1,000 Units by mouth. Patient not taking: Reported on 12/09/2022    [provider]  Cyanocobalamin  (VITAMIN B 12 PO) Take by mouth. Patient not taking: Reported on 12/09/2022    [provider]  rosuvastatin  (CRESTOR ) 10 MG tablet TAKE 1 TABLET BY MOUTH DAILY 12/08/22   Okey Vina GAILS, MD    Family History  Problem Relation Age of Onset   Heart disease Mother    Heart disease Father        pace maker   Heart disease Brother    Marfan syndrome Brother        age 74 dissection   Colon cancer Neg Hx    Colon polyps Neg Hx    Esophageal cancer Neg Hx    Rectal cancer Neg Hx    Stomach cancer Neg Hx    Breast cancer Neg Hx      Social History   Tobacco Use   Smoking status: Never   Smokeless tobacco: Never  Vaping Use   Vaping status: Never Used  Substance Use Topics   Alcohol use: No   Drug use: No    Allergies as of 01/31/2024 - Review Complete 01/31/2024  Allergen Reaction Noted   Latex Rash 12/26/2013   Prochlorperazine Nausea And Vomiting 09/05/2014    Review of Systems:    All systems reviewed and negative except where noted in HPI.   Physical Exam:  BP (!) 96/58   Pulse 63   Ht 5' 6 (1.676 m)   Wt 140 lb (63.5 kg)   LMP 03/06/2012 (Approximate)   SpO2 98%   BMI 22.60 kg/m  Patient's last menstrual period was 03/06/2012 (approximate).  General:   Alert,  Well-developed, well-nourished, pleasant and cooperative in NAD Abdomen:  Normal bowel sounds.  No bruits.  Soft, and non-distended without masses, hepatosplenomegaly or hernias noted.  No Tenderness.  No guarding or rebound tenderness.    Neurologic:  Alert and oriented x3;  grossly normal neurologically. Psych:  Alert and cooperative. Normal mood and affect.   Imaging Studies: No results found.  Labs: CBC     Component Value Date/Time   WBC 7.9 11/08/2017 0847   RBC 4.30 11/08/2017 0847   HGB 13.7 11/08/2017 0847   HCT 40.4 11/08/2017 0847   PLT 231 11/08/2017 0847   MCV 94 11/08/2017 0847    CMP     Component Value Date/Time   NA 140 11/08/2017 0847   K 5.0 11/08/2017 0847   CL 103 11/08/2017 0847   CO2 23 11/08/2017 0847   GLUCOSE 87 11/08/2017 0847   GLUCOSE 84 04/17/2013 0859   BUN 14 11/08/2017 0847   CREATININE 0.78 11/08/2017 0847   CREATININE 0.70 04/17/2013 0859   CALCIUM  10.1 11/08/2017 0847   PROT 6.7 04/17/2013 0859   ALBUMIN 4.5 04/17/2013 0859   AST 22 04/17/2013 0859  ALT 14 04/17/2013 0859   ALKPHOS 48 04/17/2013 0859   BILITOT 0.6 04/17/2013 0859   GFRNONAA 85 11/08/2017 0847   GFRAA 98 11/08/2017 0847    Assessment and Plan:   LASHINA MILLES is a 63 y.o. y/o female has been referred for:  1.  History of rectal cancer diagnosed 12/2013 s/p low anterior resection, chemo, and radiation.  Followed by oncologist at [facility].  Had a negative CEA and a normal CT chest abdomen and pelvis 07/2023.  No evidence of metastatic disease or recurrence.  2.  History of adenomatous colon polyp 03/2021 last colonoscopy by Dr. Albertus: 1 small 6 mm tubular adenoma polyp removed from ascending colon.  Prior end-to-side colocolonic anastomosis in the mid rectum with healthy mucosa.  Good prep.  5-year repeat colonoscopy was recommended (due 03/2026).  Plan: - Colon cancer screening guidelines were discussed at length.  Based on current [employer] of gastroenterology guidelines, 5-year repeat surveillance colonoscopy is appropriate.  We discussed risk versus benefit of colonoscopy procedure at length.  I offered to go ahead and schedule a 3-year repeat surveillance colonoscopy now, however patient declined after our discussion.  If she changes her mind in the next 3 months, she can call back and schedule repeat surveillance colonoscopy.  Otherwise next surveillance colonoscopy is  recommended December 2027.  We are happy to see her sooner if she develops any GI symptoms before then.  She is currently asymptomatic.  Reassurance regarding recent normal CEA and chest abdomen pelvic CT 07/2023.   Follow up December 2027 for repeat colonoscopy.  Follow-up sooner if she develops any GI symptoms.  Melanie Console, PA-C

## 2024-01-31 ENCOUNTER — Ambulatory Visit: Admitting: Physician Assistant

## 2024-01-31 ENCOUNTER — Encounter: Payer: Self-pay | Admitting: Physician Assistant

## 2024-01-31 VITALS — BP 96/58 | HR 63 | Ht 66.0 in | Wt 140.0 lb

## 2024-01-31 DIAGNOSIS — Z860100 Personal history of colon polyps, unspecified: Secondary | ICD-10-CM

## 2024-01-31 DIAGNOSIS — Z85048 Personal history of other malignant neoplasm of rectum, rectosigmoid junction, and anus: Secondary | ICD-10-CM

## 2024-01-31 NOTE — Patient Instructions (Signed)
 Please follow up sooner if symptoms increase or worsen  Due to recent changes in healthcare laws, you may see the results of your imaging and laboratory studies on MyChart before your provider has had a chance to review them.  We understand that in some cases there may be results that are confusing or concerning to you. Not all laboratory results come back in the same time frame and the provider may be waiting for multiple results in order to interpret others.  Please give us  48 hours in order for your provider to thoroughly review all the results before contacting the office for clarification of your results.   Thank you for trusting me with your gastrointestinal care!   Ellouise Console, PA-C _______________________________________________________  If your blood pressure at your visit was 140/90 or greater, please contact your primary care physician to follow up on this.  _______________________________________________________  If you are age 21 or older, your body mass index should be between 23-30. Your Body mass index is 22.6 kg/m. If this is out of the aforementioned range listed, please consider follow up with your Primary Care Provider.  If you are age 75 or younger, your body mass index should be between 19-25. Your Body mass index is 22.6 kg/m. If this is out of the aformentioned range listed, please consider follow up with your Primary Care Provider.   ________________________________________________________  The [facility] providers would like to encourage you to use MYCHART to communicate with providers for non-urgent requests or questions.  Due to long hold times on the telephone, sending your provider a message by [facility] may be a faster and more efficient way to get a response.  Please allow 48 business hours for a response.  Please remember that this is for non-urgent requests.  _______________________________________________________

## 2024-02-23 ENCOUNTER — Telehealth: Payer: Self-pay | Admitting: Internal Medicine

## 2024-02-23 ENCOUNTER — Other Ambulatory Visit: Payer: Self-pay | Admitting: Internal Medicine

## 2024-02-23 MED ORDER — ROSUVASTATIN CALCIUM 10 MG PO TABS: ORAL_TABLET | ORAL | 0 refills | Status: AC

## 2024-02-23 NOTE — Telephone Encounter (Signed)
 30 day supply sent to pharmacy.  Pt aware to call and schedule appointment.

## 2024-02-23 NOTE — Telephone Encounter (Signed)
*  STAT* If patient is at the pharmacy, call can be transferred to refill team.   1. Which medications need to be refilled? (please list name of each medication and dose if known)  rosuvastatin (CRESTOR) 10 MG tablet    2. Which pharmacy/location (including street and city if local pharmacy) is medication to be sent to? [id number] - [place], [place] - [address]   3. Do they need a 30 day or 90 day supply? 90

## 2024-05-10 ENCOUNTER — Other Ambulatory Visit: Payer: Self-pay | Admitting: Obstetrics and Gynecology

## 2024-05-10 DIAGNOSIS — Z1231 Encounter for screening mammogram for malignant neoplasm of breast: Secondary | ICD-10-CM

## 2024-05-12 ENCOUNTER — Telehealth: Payer: Self-pay | Admitting: Internal Medicine

## 2024-05-12 NOTE — Telephone Encounter (Signed)
" °*  STAT* If patient is at the pharmacy, call can be transferred to refill team.   1. Which medications need to be refilled? (please list name of each medication and dose if known)   rosuvastatin  (CRESTOR ) 10 MG tablet     2. Would you like to learn more about the convenience, safety, & potential cost savings by using the [facility]? No     3. Are you open to using the [facility] ([facility]. No    4. Which pharmacy/location (including street and city if local pharmacy) is medication to be sent to? [id number] - [place], [place] - [address]     5. Do they need a 30 day or 90 day supply? 90 day   Pt scheduled 5/5  "

## 2024-06-07 ENCOUNTER — Ambulatory Visit

## 2024-08-08 ENCOUNTER — Ambulatory Visit: Admitting: Internal Medicine
